# Patient Record
Sex: Male | Born: 1990 | Race: White | Hispanic: No | Marital: Single | Smoking: Current every day smoker
Health system: Southern US, Community
[De-identification: ages and names within clinical notes are randomized; demographics above are authoritative.]

## PROBLEM LIST (undated history)

## (undated) DIAGNOSIS — F419 Anxiety disorder, unspecified: Secondary | ICD-10-CM

## (undated) DIAGNOSIS — R569 Unspecified convulsions: Secondary | ICD-10-CM

## (undated) DIAGNOSIS — F988 Other specified behavioral and emotional disorders with onset usually occurring in childhood and adolescence: Secondary | ICD-10-CM

## (undated) HISTORY — PX: APPENDECTOMY: SHX54

## (undated) HISTORY — PX: COLON SURGERY: SHX602

## (undated) HISTORY — DX: Other specified behavioral and emotional disorders with onset usually occurring in childhood and adolescence: F98.8

## (undated) HISTORY — DX: Anxiety disorder, unspecified: F41.9

---
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
## 2013-03-17 DIAGNOSIS — R569 Unspecified convulsions: Secondary | ICD-10-CM

## 2013-03-17 HISTORY — DX: Unspecified convulsions: R56.9

## 2013-12-20 ENCOUNTER — Encounter (HOSPITAL_COMMUNITY): Payer: Self-pay | Admitting: Emergency Medicine

## 2013-12-20 ENCOUNTER — Emergency Department (HOSPITAL_COMMUNITY): Payer: Managed Care, Other (non HMO)

## 2013-12-20 ENCOUNTER — Emergency Department (HOSPITAL_COMMUNITY)
Admission: EM | Admit: 2013-12-20 | Discharge: 2013-12-21 | Disposition: A | Discharge status: Home / Self Care | Payer: Managed Care, Other (non HMO) | Attending: Emergency Medicine | Admitting: Emergency Medicine

## 2013-12-20 DIAGNOSIS — R569 Unspecified convulsions: Secondary | ICD-10-CM | POA: Diagnosis not present

## 2013-12-20 DIAGNOSIS — R4182 Altered mental status, unspecified: Secondary | ICD-10-CM | POA: Diagnosis not present

## 2013-12-20 DIAGNOSIS — Z79899 Other long term (current) drug therapy: Secondary | ICD-10-CM | POA: Insufficient documentation

## 2013-12-20 DIAGNOSIS — Y92410 Unspecified street and highway as the place of occurrence of the external cause: Secondary | ICD-10-CM | POA: Insufficient documentation

## 2013-12-20 DIAGNOSIS — Z720 Tobacco use: Secondary | ICD-10-CM | POA: Diagnosis not present

## 2013-12-20 DIAGNOSIS — Z043 Encounter for examination and observation following other accident: Secondary | ICD-10-CM | POA: Diagnosis present

## 2013-12-20 DIAGNOSIS — Y9389 Activity, other specified: Secondary | ICD-10-CM | POA: Insufficient documentation

## 2013-12-20 HISTORY — DX: Unspecified convulsions: R56.9

## 2013-12-20 LAB — BASIC METABOLIC PANEL
Anion gap: 12 (ref 5–15)
BUN: 15 mg/dL (ref 6–23)
CO2: 27 meq/L (ref 19–32)
CREATININE: 1.1 mg/dL (ref 0.50–1.35)
Calcium: 9.1 mg/dL (ref 8.4–10.5)
Chloride: 101 mEq/L (ref 96–112)
GFR calc Af Amer: >90 mL/min (ref >90)
GFR calc non Af Amer: >90 mL/min (ref >90)
GLUCOSE: 90 mg/dL (ref 70–99)
Potassium: 4.1 mEq/L (ref 3.7–5.3)
Sodium: 140 mEq/L (ref 137–147)

## 2013-12-20 LAB — CBG MONITORING, ED: Glucose-Capillary: 79 mg/dL (ref 70–99)

## 2013-12-20 LAB — CBC
HEMATOCRIT: 41.5 % (ref 39.0–52.0)
HEMOGLOBIN: 14.1 g/dL (ref 13.0–17.0)
MCH: 30.9 pg (ref 26.0–34.0)
MCHC: 34 g/dL (ref 30.0–36.0)
MCV: 91 fL (ref 78.0–100.0)
Platelets: 175 10*3/uL (ref 150–400)
RBC: 4.56 MIL/uL (ref 4.22–5.81)
RDW: 12.6 % (ref 11.5–15.5)
WBC: 9.5 10*3/uL (ref 4.0–10.5)
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
## 2013-12-21 MED ORDER — AMPHETAMINE-DEXTROAMPHETAMINE 30 MG PO TABS: 30.0000 mg | ORAL_TABLET | Freq: Every day | ORAL | Status: DC

## 2013-12-21 MED ORDER — IBUPROFEN 400 MG PO TABS
400.0000 mg | ORAL_TABLET | Freq: Once | ORAL | Status: AC
  Administered 2013-12-21: 400 mg via ORAL
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
## 2013-12-27 ENCOUNTER — Encounter: Payer: Self-pay | Admitting: Neurology

## 2013-12-27 ENCOUNTER — Ambulatory Visit (INDEPENDENT_AMBULATORY_CARE_PROVIDER_SITE_OTHER): Payer: Managed Care, Other (non HMO) | Admitting: Neurology

## 2013-12-27 ENCOUNTER — Encounter: Payer: Self-pay | Admitting: *Deleted

## 2013-12-27 VITALS — BP 143/91 | HR 79 | Ht 73.0 in | Wt 177.6 lb

## 2013-12-27 DIAGNOSIS — F909 Attention-deficit hyperactivity disorder, unspecified type: Secondary | ICD-10-CM

## 2013-12-27 NOTE — Patient Instructions (Addendum)
I had a long discussion with the patient about his episode of altered consciousness and possible seizure discuss seizure provoking factors likely deprivation and klonopin/Ritalin withdrawal. He was counseled to avoid seizure provoking triggers like sleep deprivation, irregular eating and sleeping habits, abuse drugs, alcohol and marijuana. Check EEG and MRI scan of the brain. We will hold off on anticonvulsants at the present time unless the EEG or MRI are abnormal or he has a second episode He was advised not to drive for the next 6 months as per [facility] [place] law and not to operate heavy equipment. Return for followup in 2 months or call earlier if necessary  Seizure, Adult A seizure is abnormal electrical activity in the brain. Seizures usually last from 30 seconds to 2 minutes. There are various types of seizures. Before a seizure, you may have a warning sensation (aura) that a seizure is about to occur. An aura may include the following symptoms:   Fear or anxiety.  Nausea.  Feeling like the room is spinning (vertigo).  Vision changes, such as seeing flashing lights or spots. Common symptoms during a seizure include:  A change in attention or behavior (altered mental status).  Convulsions with rhythmic jerking movements.  Drooling.  Rapid eye movements.  Grunting.  Loss of bladder and bowel control.  Bitter taste in the mouth.  Tongue biting. After a seizure, you may feel confused and sleepy. You may also have an injury resulting from convulsions during the seizure. HOME CARE INSTRUCTIONS   If you are given medicines, take them exactly as prescribed by your health care provider.  Keep all follow-up appointments as directed by your health care provider.  Do not swim or drive or engage in risky activity during which a seizure could cause further injury to you or others until your health care provider says it is OK.  Get adequate rest.  Teach friends and family what to do  if you have a seizure. They should:  Lay you on the ground to prevent a fall.  Put a cushion under your head.  Loosen any tight clothing around your neck.  Turn you on your side. If vomiting occurs, this helps keep your airway clear.  Stay with you until you recover.  Know whether or not you need emergency care. SEEK IMMEDIATE MEDICAL CARE IF:  The seizure lasts longer than 5 minutes.  The seizure is severe or you do not wake up immediately after the seizure.  You have an altered mental status after the seizure.  You are having more frequent or worsening seizures. Someone should drive you to the emergency department or call local emergency services (911 in [place] MAKE SURE YOU:  Understand these instructions.  Will watch your condition.  Will get help right away if you are not doing well or get worse. Document Released: 02/29/2000 Document Revised: 12/22/2012 Document Reviewed: 10/13/2012 [facility] Patient Information 2015 [place], [place]. This information is not intended to replace advice given to you by your health care provider. Make sure you discuss any questions you have with your health care provider.
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
## 2013-12-29 ENCOUNTER — Other Ambulatory Visit (INDEPENDENT_AMBULATORY_CARE_PROVIDER_SITE_OTHER): Payer: Managed Care, Other (non HMO)

## 2013-12-29 ENCOUNTER — Encounter: Payer: Self-pay | Admitting: Neurology

## 2013-12-29 ENCOUNTER — Telehealth: Payer: Self-pay | Admitting: Neurology

## 2013-12-29 DIAGNOSIS — R404 Transient alteration of awareness: Secondary | ICD-10-CM

## 2013-12-29 DIAGNOSIS — F909 Attention-deficit hyperactivity disorder, unspecified type: Secondary | ICD-10-CM

## 2013-12-29 NOTE — Telephone Encounter (Signed)
Called pt to inform him that Dr. Pearlean BrownieSethi has written his note for work and note left at the front desk and if he has any other problem, questions or concerns to call the office. Pt verbalized understanding.

## 2013-12-30 ENCOUNTER — Telehealth: Payer: Self-pay | Admitting: Neurology

## 2013-12-30 NOTE — Telephone Encounter (Signed)
pts mother is on release--calling to get results of eeg for son please call patient

## 2013-12-30 NOTE — Telephone Encounter (Signed)
EEG is normal. Kindly inform her

## 2014-01-02 ENCOUNTER — Telehealth: Payer: Self-pay | Admitting: Neurology

## 2014-01-02 NOTE — Telephone Encounter (Signed)
pts mother is on ther release for information and was given the normal eeg results.  Mother, Harriett Sineancy says son needs a release note so that son can return to work since.  Please let patient know if he is able to return to work and provide letter today.

## 2014-01-04 ENCOUNTER — Encounter: Payer: Self-pay | Admitting: *Deleted

## 2014-01-04 ENCOUNTER — Ambulatory Visit (INDEPENDENT_AMBULATORY_CARE_PROVIDER_SITE_OTHER): Payer: Managed Care, Other (non HMO)

## 2014-01-04 ENCOUNTER — Other Ambulatory Visit: Payer: Managed Care, Other (non HMO)

## 2014-01-04 DIAGNOSIS — F909 Attention-deficit hyperactivity disorder, unspecified type: Secondary | ICD-10-CM

## 2014-01-04 NOTE — Telephone Encounter (Signed)
Patient is here today in the lobby requesting note, he is having MRI done today, Dr Pearlean BrownieSethi is out of the office this week, waiting on patient to get out of MRI to complete letter.

## 2014-01-05 NOTE — Telephone Encounter (Signed)
Patient calling for release note.  Please call when ready for pick up

## 2014-01-06 ENCOUNTER — Encounter: Payer: Self-pay | Admitting: Neurology

## 2014-01-06 NOTE — Telephone Encounter (Signed)
Waiting on approval to write letter from Dr Pearlean BrownieSethi.

## 2014-01-06 NOTE — Telephone Encounter (Signed)
Patient's mother came in today and picked up letter for patient but has some concerns about his diagnosis, she wanted to know if all of his test were coming back normal then why was he being diagnosed with seizure? Informed her that Dr Pearlean BrownieSethi is out of the office and that he would have to respond to her question when he returns back in the office.

## 2014-01-06 NOTE — Telephone Encounter (Signed)
Patient's letter was written by Dr Roda ShuttersXu, patient has been called to pick up letter.

## 2014-01-12 NOTE — Telephone Encounter (Signed)
Spoke to mom and answered questions. Need to move f/u appointment earlier to early December

## 2014-01-13 NOTE — Telephone Encounter (Signed)
Called patient and left voice message to call the office back to r/s appointment for when Dr Pearlean BrownieSethi is in the office.

## 2014-01-17 ENCOUNTER — Telehealth: Payer: Self-pay | Admitting: *Deleted

## 2014-01-17 NOTE — Telephone Encounter (Signed)
Form,F[employer] to [place] and Dr Pearlean BrownieSethi 01-17-14.

## 2014-01-18 DIAGNOSIS — Z0289 Encounter for other administrative examinations: Secondary | ICD-10-CM

## 2014-02-01 NOTE — Telephone Encounter (Signed)
Patient's mother checking status of FMLA paperwork dropped off 11/3.  Please call and advise.

## 2014-02-02 NOTE — Telephone Encounter (Signed)
Spoke with patient's mother informed her that form just needs to be signed by Dr Pearlean BrownieSethi. Form is in Dr Marlis EdelsonSethi's office in his box. Dr Pearlean BrownieSethi when you return to the office please complete.

## 2014-02-06 NOTE — Telephone Encounter (Signed)
Form, FMLA [employer] received from Dr Pearlean BrownieSethi and Andreas Blowerashia completed at front desk for patient 02/06/14.

## 2014-02-23 ENCOUNTER — Ambulatory Visit (INDEPENDENT_AMBULATORY_CARE_PROVIDER_SITE_OTHER): Payer: Managed Care, Other (non HMO) | Admitting: Family Medicine

## 2014-02-23 ENCOUNTER — Encounter: Payer: Self-pay | Admitting: Family Medicine

## 2014-02-23 VITALS — BP 122/78 | HR 82 | Temp 98.6°F | Resp 18 | Ht 72.0 in | Wt 195.0 lb

## 2014-02-23 DIAGNOSIS — R569 Unspecified convulsions: Secondary | ICD-10-CM

## 2014-02-23 DIAGNOSIS — F900 Attention-deficit hyperactivity disorder, predominantly inattentive type: Secondary | ICD-10-CM

## 2014-02-23 DIAGNOSIS — F411 Generalized anxiety disorder: Secondary | ICD-10-CM

## 2014-02-23 DIAGNOSIS — F909 Attention-deficit hyperactivity disorder, unspecified type: Secondary | ICD-10-CM

## 2014-02-23 MED ORDER — AMPHETAMINE-DEXTROAMPHETAMINE 30 MG PO TABS: 30.0000 mg | ORAL_TABLET | Freq: Two times a day (BID) | ORAL | Status: DC

## 2014-02-23 MED ORDER — AMPHETAMINE-DEXTROAMPHETAMINE 30 MG PO TABS
30.0000 mg | ORAL_TABLET | Freq: Two times a day (BID) | ORAL | Status: DC
Start: 2014-02-23 — End: 2014-06-19

## 2014-02-23 MED ORDER — CLONAZEPAM 1 MG PO TABS: 1.0000 mg | ORAL_TABLET | Freq: Two times a day (BID) | ORAL | Status: DC | PRN

## 2014-02-23 MED ORDER — AMPHETAMINE-DEXTROAMPHETAMINE 30 MG PO TABS
30.0000 mg | ORAL_TABLET | Freq: Two times a day (BID) | ORAL | Status: DC
Start: 2014-02-23 — End: 2014-02-23

## 2014-02-23 NOTE — Progress Notes (Signed)
Office Note 03/03/2014  CC:  Chief Complaint  Patient presents with  . Establish Care    [facility]   HPI:  Jose Rivera is a 23 y.o.  male who is here to establish care.  Patient's most recent primary MD: [facility]. Old records in EPIC/HL (neuro) were reviewed prior to or during today's visit.   He is interested in getting back on his adderall and clonazepam. He has had some trouble with having to see multiple providers and go back to his PCP office on many occasions to get his adderall and clonaz rx's renewed.  He was off them for a time in October and was sleep deprived and he had a seizure.  Saw neurologist, normal w/u and no meds started unless they become recurrent (which they have not) or if MRI or EEG abnormal (both were normal).  He was told by Dr. Pearlean BrownieSethi not to drive but pt says "I have to drive in order to keep my job".  Says he has been on adderall at least 12 years for ADHD and clonazepam for about 7 years for anxiety. Says current doses of both meds have been effective.  Past Medical History  Diagnosis Date  . Seizure 2015    x 1, with normal EEG and normal brain MRI: no meds started by neurologist.  Also childhood hx of febrile seizures  . ADD (attention deficit disorder)   . Anxiety     Past Surgical History  Procedure Laterality Date  . Appendectomy    . Colon surgery      ? volvulus related to appendicitis? pt unable to recall or describe well but says some of colon was removed.    Family History  Problem Relation Age of Onset  . Alcohol abuse Father     History   Social History  . Marital Status: Single    Spouse Name: N/A    Number of Children: 2  . Years of Education: 12th   Occupational History  . warehouse    Social History Main Topics  . Smoking status: Current Every Day Smoker    Types: Cigarettes  . Smokeless tobacco: Never Used  . Alcohol Use: No  . Drug Use: No     Comment: 2 months  . Sexual Activity:     Partners: Female   Other Topics Concern  . Not on file   Social History Narrative   Patient lives at home with his mother.  Pt is single, has 2 sons, one of which lives with him.   Patient is right handed.   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   HS education.   Patient drink sodas and sweet tea.   No T/A/Ds.    Outpatient Encounter Prescriptions as of 02/23/2014  Medication Sig  . amphetamine-dextroamphetamine (ADDERALL) 30 MG tablet Take 1 tablet by mouth 2 (two) times daily.  . clonazePAM (KLONOPIN) 1 MG tablet Take 1 tablet (1 mg total) by mouth 2 (two) times daily as needed.  . [DISCONTINUED] Amphetamine-Dextroamphetamine (ADDERALL PO) Take 30 mg by mouth 2 (two) times daily.   . [DISCONTINUED] amphetamine-dextroamphetamine (ADDERALL) 30 MG tablet Take 1 tablet by mouth 2 (two) times daily.  . [DISCONTINUED] amphetamine-dextroamphetamine (ADDERALL) 30 MG tablet Take 1 tablet by mouth 2 (two) times daily.  . [DISCONTINUED] clonazePAM (KLONOPIN) 1 MG tablet Take 1 mg by mouth 2 (two) times daily as needed.     No Known Allergies  ROS Review  of Systems  Constitutional: Negative for fever and fatigue.  HENT: Negative for congestion and sore throat.   Eyes: Negative for visual disturbance.  Respiratory: Negative for cough.   Cardiovascular: Negative for chest pain.  Gastrointestinal: Negative for nausea and abdominal pain.  Genitourinary: Negative for dysuria.  Musculoskeletal: Negative for back pain and joint swelling.  Skin: Negative for rash.       A few axillary cysts recently but these have dried up pretty well now.  Neurological: Negative for weakness and headaches.  Hematological: Negative for adenopathy.    PE; Blood pressure 122/78, pulse 82, temperature 98.6 F (37 C), temperature source Temporal, resp. rate 18, height 6' (1.829 m), weight 195 lb (88.451 kg), SpO2 98 %. Wt Readings from Last 2 Encounters:  02/23/14 195 lb (88.451 kg)   12/27/13 177 lb 9.6 oz (80.559 kg)    Gen: alert, oriented x 4, affect pleasant.  Lucid thinking and conversation noted. HEENT: PERRLA, EOMI.   Neck: no LAD, mass, or thyromegaly. CV: RRR, no m/r/g LUNGS: CTA bilat, nonlabored. NEURO: no tremor or tics noted on observation.  Coordination intact. CN 2-12 grossly intact bilaterally, strength 5/5 in all extremeties.  No ataxia.  Pertinent labs:  Lab Results  Component Value Date   WBC 9.5 12/20/2013   HGB 14.1 12/20/2013   HCT 41.5 12/20/2013   MCV 91.0 12/20/2013   PLT 175 12/20/2013     Chemistry      Component Value Date/Time   NA 140 12/20/2013 1932   K 4.1 12/20/2013 1932   CL 101 12/20/2013 1932   CO2 27 12/20/2013 1932   BUN 15 12/20/2013 1932   CREATININE 1.10 12/20/2013 1932      Component Value Date/Time   CALCIUM 9.1 12/20/2013 1932      ASSESSMENT AND PLAN:   New pt; obtain old records.  1) Adult ADHD and generalized anxiety, medicated long term.  I checked the [place] Controlled substances reporting system web site and it correlates with who he says was rx'ing his adderall and clonazepam. Last adderall and clonaz fill date was 12/29/13. Controlled substance contract signed by pt today. I printed rx's for adderall 30mg , 1 bid and clonazepam 1mg , 1 bid, #60 today for this month, Jan 2016, and Feb 2016.  Appropriate fill on/after date was noted on each rx.  2) Seizure: I discouraged pt from driving.  He really thinks the seizure was from w/drawal from adderall, clonaz, + sleep deprivation.    An After Visit Summary was printed and given to the patient.  Return in about 4 months (around 06/25/2014) for f/u ADD and anxiety.

## 2014-02-23 NOTE — Telephone Encounter (Signed)
Jasmine DecemberSharon with [employer] @ [phone], stated FMLA paperwork received wasn't completed.  Please call and advise.

## 2014-02-24 ENCOUNTER — Telehealth: Payer: Self-pay | Admitting: General Practice

## 2014-02-24 NOTE — Telephone Encounter (Signed)
emmi mailed  °

## 2014-02-27 NOTE — Telephone Encounter (Signed)
Retrieved form from Medical Records called number listed below for [facility], phone rings one time then complete silence, no one never answers the phone. Will try again at a later time.

## 2014-02-27 NOTE — Telephone Encounter (Signed)
DIRECTVCalled insurance company back and spoke with Burnetta Sabinvey, she states that patient was supposed to have disability form filled out, she could not tell me what needed to be corrected on FMLA paperwork. Fax number was given to fax disability form to our office.

## 2014-03-03 ENCOUNTER — Encounter: Payer: Self-pay | Admitting: Family Medicine

## 2014-04-17 ENCOUNTER — Telehealth: Payer: Self-pay | Admitting: Neurology

## 2014-04-17 NOTE — Telephone Encounter (Signed)
Pt is calling to cancel his appt for 04/19/14.  He states he does not feel the need to come back, but he is requesting a phone call back and a letter written to release him to go back to work by 05/21/14.  Please call and advise.

## 2014-04-18 NOTE — Telephone Encounter (Signed)
Dr Pearlean BrownieSethi patient requesting form completed so that he can go back to work on 05/21/14 please complete, form has been here since early December

## 2014-04-19 ENCOUNTER — Telehealth: Payer: Self-pay | Admitting: *Deleted

## 2014-04-19 ENCOUNTER — Ambulatory Visit: Payer: Managed Care, Other (non HMO) | Admitting: Neurology

## 2014-04-19 NOTE — Telephone Encounter (Signed)
Form,[employer] received,completed by Dr Pearlean BrownieSethi and Andreas Blowerashia at front desk for patient 04-19-14.

## 2014-05-10 ENCOUNTER — Telehealth: Payer: Self-pay | Admitting: Family Medicine

## 2014-05-10 NOTE — Telephone Encounter (Signed)
ClonazePam [facility]

## 2014-05-10 NOTE — Telephone Encounter (Signed)
Patient's clonazepam has 5 refills on it.  Pt didn't understand that when I called him. He voiced understanding that he has refills and he will contact pharmacy.

## 2014-06-01 ENCOUNTER — Telehealth: Payer: Self-pay | Admitting: Neurology

## 2014-06-01 NOTE — Telephone Encounter (Signed)
Pt is calling stating that on 4/6 he will be seizure free for 6 months and would like a letter stating that he is ready to go back to work.  Please call and advise.

## 2014-06-02 NOTE — Telephone Encounter (Signed)
Yes he can drive from 1/6/104/6/16 as it will be 6 months since his episode

## 2014-06-02 NOTE — Telephone Encounter (Signed)
Patient states that he will be seizure free on 4/6 and would like a letter so that he can drive again and go back to work.

## 2014-06-02 NOTE — Telephone Encounter (Signed)
Spoke with patient and informed him that letter can not be written until April 6 th, informed patient to call either April 5 th or April 6 th and letter will be written.

## 2014-06-19 ENCOUNTER — Encounter: Payer: Self-pay | Admitting: Family Medicine

## 2014-06-19 ENCOUNTER — Ambulatory Visit (INDEPENDENT_AMBULATORY_CARE_PROVIDER_SITE_OTHER): Payer: Managed Care, Other (non HMO) | Admitting: Family Medicine

## 2014-06-19 VITALS — BP 114/60 | HR 69 | Temp 98.2°F | Ht 72.0 in | Wt 205.0 lb

## 2014-06-19 DIAGNOSIS — F411 Generalized anxiety disorder: Secondary | ICD-10-CM | POA: Insufficient documentation

## 2014-06-19 DIAGNOSIS — F419 Anxiety disorder, unspecified: Secondary | ICD-10-CM | POA: Insufficient documentation

## 2014-06-19 DIAGNOSIS — F900 Attention-deficit hyperactivity disorder, predominantly inattentive type: Secondary | ICD-10-CM | POA: Diagnosis not present

## 2014-06-19 DIAGNOSIS — R569 Unspecified convulsions: Secondary | ICD-10-CM | POA: Diagnosis not present

## 2014-06-19 MED ORDER — AMPHETAMINE-DEXTROAMPHETAMINE 30 MG PO TABS: 30.0000 mg | ORAL_TABLET | Freq: Two times a day (BID) | ORAL | Status: DC

## 2014-06-19 MED ORDER — AMPHETAMINE-DEXTROAMPHETAMINE 30 MG PO TABS
30.0000 mg | ORAL_TABLET | Freq: Two times a day (BID) | ORAL | Status: DC
Start: 2014-06-19 — End: 2014-06-19

## 2014-06-19 NOTE — Progress Notes (Signed)
OFFICE NOTE  06/19/2014  CC:  Chief Complaint  Patient presents with  . Follow-up     HPI: Patient is a 24 y.o. Caucasian male who is here for 4 mo f/u ADD and anxiety. Says all is well.  Has visitation rights with his children now. Restarts his job in a couple days after having been out due to seizure: he has had no recurrence.  Neuro has cleared him to drive and return to work. Still currently living with his mother.   Pertinent PMH:  Past medical, surgical, social, and family history reviewed and no changes are noted since last office visit.  MEDS:  Outpatient Prescriptions Prior to Visit  Medication Sig Dispense Refill  . amphetamine-dextroamphetamine (ADDERALL) 30 MG tablet Take 1 tablet by mouth 2 (two) times daily. 60 tablet 0  . clonazePAM (KLONOPIN) 1 MG tablet Take 1 tablet (1 mg total) by mouth 2 (two) times daily as needed. 60 tablet 5   No facility-administered medications prior to visit.    PE: Blood pressure 114/60, pulse 69, temperature 98.2 F (36.8 C), temperature source Oral, height 6' (1.829 m), weight 205 lb (92.987 kg), SpO2 97 %. Wt Readings from Last 2 Encounters:  06/19/14 205 lb (92.987 kg)  02/23/14 195 lb (88.451 kg)    Gen: alert, oriented x 4, affect pleasant.  Lucid thinking and conversation noted. HEENT: PERRLA, EOMI.   Neck: no LAD, mass, or thyromegaly. CV: RRR, no m/r/g LUNGS: CTA bilat, nonlabored. NEURO: no tremor or tics noted on observation.  Coordination intact. CN 2-12 grossly intact bilaterally, strength 5/5 in all extremeties.  No ataxia.   IMPRESSION AND PLAN:  1) ADHD, inattentive type: The current medical regimen is effective;  continue present plan and medications. I printed rx's for adderall 30mg  1 tab bid, #60  today for this month, May 2016, and June 2016.  Appropriate fill on/after date was noted on each rx.  2) Generalized anxiety: The current medical regimen is effective;  continue present plan and  medications. No new rx needed today.  3) Hx of seizure: one time, no recurrence.  W/u unrevealing.  No meds. Neuro has cleared him to work again, drive again.  An After Visit Summary was printed and given to the patient.  FOLLOW UP: 1mo

## 2014-06-19 NOTE — Progress Notes (Signed)
Pre visit review using our clinic review tool, if applicable. No additional management support is needed unless otherwise documented below in the visit note. 

## 2014-06-20 NOTE — Telephone Encounter (Signed)
Spoke to patient's mom. Advised letter would be available for pick up at the front desk. She was grateful.

## 2014-06-20 NOTE — Telephone Encounter (Signed)
Patient calling back as instructed to check status of letter.  Please call and advise.
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
## 2021-01-23 ENCOUNTER — Encounter (HOSPITAL_BASED_OUTPATIENT_CLINIC_OR_DEPARTMENT_OTHER): Payer: Self-pay | Admitting: Emergency Medicine

## 2021-01-23 ENCOUNTER — Other Ambulatory Visit: Payer: Self-pay

## 2021-01-23 DIAGNOSIS — R030 Elevated blood-pressure reading, without diagnosis of hypertension: Secondary | ICD-10-CM | POA: Insufficient documentation

## 2021-01-23 DIAGNOSIS — K029 Dental caries, unspecified: Secondary | ICD-10-CM | POA: Insufficient documentation

## 2021-01-23 DIAGNOSIS — F17210 Nicotine dependence, cigarettes, uncomplicated: Secondary | ICD-10-CM | POA: Insufficient documentation

## 2021-01-23 NOTE — ED Triage Notes (Signed)
Pt POV reports dental pain to left side upper and lower wisdom teeth area. Pt reports dental carries in the area, teeth are also broken. Denies fevers. C/o headache, sinus congestion. No difficulty swallowing, no facial swelling.

## 2021-01-24 ENCOUNTER — Emergency Department (HOSPITAL_BASED_OUTPATIENT_CLINIC_OR_DEPARTMENT_OTHER)
Admission: EM | Admit: 2021-01-24 | Discharge: 2021-01-24 | Disposition: A | Discharge status: Home / Self Care | Payer: Managed Care, Other (non HMO) | Attending: Emergency Medicine | Admitting: Emergency Medicine

## 2021-01-24 DIAGNOSIS — K029 Dental caries, unspecified: Secondary | ICD-10-CM

## 2021-01-24 MED ORDER — OXYCODONE-ACETAMINOPHEN 5-325 MG PO TABS: 1.0000 | ORAL_TABLET | ORAL | 0 refills | Status: DC | PRN

## 2021-01-24 MED ORDER — OXYCODONE-ACETAMINOPHEN 5-325 MG PO TABS
1.0000 | ORAL_TABLET | Freq: Once | ORAL | Status: AC
Start: 2021-01-24 — End: 2021-01-24
  Administered 2021-01-24: 1 via ORAL
  Filled 2021-01-24: qty 1

## 2021-01-24 MED ORDER — AMOXICILLIN 500 MG PO CAPS: 1000.0000 mg | ORAL_CAPSULE | Freq: Two times a day (BID) | ORAL | 0 refills | Status: DC

## 2021-01-24 MED ORDER — AMOXICILLIN 500 MG PO CAPS
1000.0000 mg | ORAL_CAPSULE | Freq: Once | ORAL | Status: AC
  Administered 2021-01-24: 1000 mg via ORAL
  Filled 2021-01-24: qty 2

## 2021-01-24 NOTE — Discharge Instructions (Addendum)
You may take acetaminophen and/or ibuprofen as needed for pain.  Please note that if you combine them, you will get better pain relief than taking either medication by itself.  However, never take more than 4 ibuprofen tablets at a time and never take more than 2 acetaminophen tablets at a time.  Maximum number of ibuprofen tablets you can safely take in a day is 12, maximum number of acetaminophen tablets you can take in a day is 8.  Taking more than these maximum amounts puts you at risk for kidney damage, bleeding ulcers, liver failure.

## 2021-01-24 NOTE — ED Provider Notes (Signed)
[facility] EMERGENCY DEPT Provider Note   CSN: [id number] Arrival date & time: 01/23/21  2030     History Chief Complaint  Patient presents with   Dental Pain    Jose Rivera is a 30 y.o. male.  The history is provided by the patient.  Dental Pain He has history of attention deficit disorder and comes in complaining of pain in both of the wisdom teeth on his left side.  He states that he has been having difficulty with his teeth for several years, but yesterday the left upper wisdom tooth broke and it has been more painful.  He rates pain a 10/10.  He has been taking large amounts of acetaminophen and ibuprofen for pain without relief.  He is very reluctant to tell me how many pain pills he has taken.  He has tried to get into see a dentist, but they are requesting more money upfront than he has available currently.  He has had the wisdom teeth on the right side removed.   Past Medical History:  Diagnosis Date   ADD (attention deficit disorder)    Anxiety    Seizure (HCC) 2015   x 1, with normal EEG and normal brain MRI: no meds started by neurologist.  Also childhood hx of febrile seizures    Patient Active Problem List   Diagnosis Date Noted   Anxiety state 06/19/2014   ADHD (attention deficit hyperactivity disorder) 12/27/2013   Seizure (HCC)     Past Surgical History:  Procedure Laterality Date   APPENDECTOMY     COLON SURGERY     ? volvulus related to appendicitis? pt unable to recall or describe well but says some of colon was removed.       Family History  Problem Relation Age of Onset   Alcohol abuse Father     Social History   Tobacco Use   Smoking status: Every Day    Types: Cigarettes   Smokeless tobacco: Never  Substance Use Topics   Alcohol use: No   Drug use: No    Types: Marijuana    Comment: 2 months    Home Medications Prior to Admission medications   Medication Sig Start Date End Date Taking? Authorizing Provider   amoxicillin (AMOXIL) 500 MG capsule Take 2 capsules (1,000 mg total) by mouth 2 (two) times daily. 01/24/21  Yes Dione Booze, MD  oxyCODONE-acetaminophen (PERCOCET) 5-325 MG tablet Take 1 tablet by mouth every 4 (four) hours as needed for moderate pain. 01/24/21  Yes Dione Booze, MD  amphetamine-dextroamphetamine (ADDERALL) 30 MG tablet Take 1 tablet by mouth 2 (two) times daily. 06/19/14   McGowen, Maryjean Morn, MD  clonazePAM (KLONOPIN) 1 MG tablet Take 1 tablet (1 mg total) by mouth 2 (two) times daily as needed. 02/23/14   McGowen, Maryjean Morn, MD    Allergies    Patient has no known allergies.  Review of Systems   Review of Systems  All other systems reviewed and are negative.  Physical Exam Updated Vital Signs BP (!) 143/91 (BP Location: Right Arm)   Pulse 68   Temp 98 F (36.7 C) (Oral)   Resp 15   Ht 6\' 1"  (1.854 m)   Wt 90.7 kg   SpO2 100%   BMI 26.39 kg/m   Physical Exam Vitals and nursing note reviewed.  30 year old male, resting comfortably and in no acute distress. Vital signs are significant for borderline elevated blood pressure. Oxygen saturation is 100%, which is  normal. Head is normocephalic and atraumatic. PERRLA, EOMI. Oropharynx is clear.  Teeth number 16 and 17 are carious and tender to percussion.  There is no swelling, erythema, pallor of the underlying gingiva. Neck is nontender and supple without adenopathy . Lungs are clear without rales, wheezes, or rhonchi. Chest is nontender. Heart has regular rate and rhythm without murmur. Abdomen is soft, flat, nontender. Extremities have no cyanosis or edema, full range of motion is present. Skin is warm and dry without rash. Neurologic: Mental status is normal, cranial nerves are intact, moves all extremities equally.  ED Results / Procedures / Treatments    Procedures Procedures   Medications Ordered in ED Medications  oxyCODONE-acetaminophen (PERCOCET/ROXICET) 5-325 MG per tablet 1 tablet (1 tablet Oral  Given 01/24/21 0206)  amoxicillin (AMOXIL) capsule 1,000 mg (1,000 mg Oral Given 01/24/21 0204)    ED Course  I have reviewed the triage vital signs and the nursing notes.  MDM Rules/Calculators/A&P                         Dental pain secondary to carious third molars.  Old records are reviewed, and he has no relevant past visits.  He is given prescription for amoxicillin and a small number of oxycodone-acetaminophen tablets.  He is given Writer page and also referred to the on-call dentist.  Final Clinical Impression(s) / ED Diagnoses Final diagnoses:  Pain due to dental caries    Rx / DC Orders ED Discharge Orders          Ordered    amoxicillin (AMOXIL) 500 MG capsule  2 times daily        01/24/21 0206    oxyCODONE-acetaminophen (PERCOCET) 5-325 MG tablet  Every 4 hours PRN        01/24/21 0206             Dione Booze, MD 01/24/21 [phone]

## 2021-06-02 ENCOUNTER — Emergency Department (HOSPITAL_COMMUNITY)
Admission: EM | Admit: 2021-06-02 | Discharge: 2021-06-02 | Disposition: A | Discharge status: Home / Self Care | Payer: 59 | Attending: Emergency Medicine | Admitting: Emergency Medicine

## 2021-06-02 ENCOUNTER — Other Ambulatory Visit: Payer: Self-pay

## 2021-06-02 ENCOUNTER — Encounter (HOSPITAL_COMMUNITY): Payer: Self-pay | Admitting: Emergency Medicine

## 2021-06-02 DIAGNOSIS — K5900 Constipation, unspecified: Secondary | ICD-10-CM | POA: Diagnosis not present

## 2021-06-02 DIAGNOSIS — R109 Unspecified abdominal pain: Secondary | ICD-10-CM | POA: Diagnosis present

## 2021-06-02 DIAGNOSIS — R110 Nausea: Secondary | ICD-10-CM | POA: Diagnosis not present

## 2021-06-02 LAB — URINALYSIS, ROUTINE W REFLEX MICROSCOPIC
Bilirubin Urine: NEGATIVE
Glucose, UA: NEGATIVE mg/dL
Hgb urine dipstick: NEGATIVE
Ketones, ur: NEGATIVE mg/dL
Leukocytes,Ua: NEGATIVE
Nitrite: NEGATIVE
Protein, ur: NEGATIVE mg/dL
Specific Gravity, Urine: 1.015 (ref 1.005–1.030)
pH: 7.5 (ref 5.0–8.0)

## 2021-06-02 LAB — COMPREHENSIVE METABOLIC PANEL
ALT: 19 U/L (ref 0–44)
AST: 17 U/L (ref 15–41)
Albumin: 4 g/dL (ref 3.5–5.0)
Alkaline Phosphatase: 35 U/L — ABNORMAL LOW (ref 38–126)
Anion gap: 6 (ref 5–15)
BUN: 15 mg/dL (ref 6–20)
CO2: 29 mmol/L (ref 22–32)
Calcium: 9.2 mg/dL (ref 8.9–10.3)
Chloride: 105 mmol/L (ref 98–111)
Creatinine, Ser: 1.27 mg/dL — ABNORMAL HIGH (ref 0.61–1.24)
GFR, Estimated: >60 mL/min (ref >60)
Glucose, Bld: 90 mg/dL (ref 70–99)
Potassium: 4.5 mmol/L (ref 3.5–5.1)
Sodium: 140 mmol/L (ref 135–145)
Total Bilirubin: 0.7 mg/dL (ref 0.3–1.2)
Total Protein: 6.9 g/dL (ref 6.5–8.1)

## 2021-06-02 LAB — CBC
HCT: 43.6 % (ref 39.0–52.0)
Hemoglobin: 14.8 g/dL (ref 13.0–17.0)
MCH: 30.6 pg (ref 26.0–34.0)
MCHC: 33.9 g/dL (ref 30.0–36.0)
MCV: 90.1 fL (ref 80.0–100.0)
Platelets: 206 10*3/uL (ref 150–400)
RBC: 4.84 MIL/uL (ref 4.22–5.81)
RDW: 11.9 % (ref 11.5–15.5)
WBC: 5.4 10*3/uL (ref 4.0–10.5)
nRBC: 0 % (ref 0.0–0.2)

## 2021-06-02 LAB — LIPASE, BLOOD: Lipase: 40 U/L (ref 11–51)

## 2021-06-02 MED ORDER — POLYETHYLENE GLYCOL 3350 17 GM/SCOOP PO POWD: 1.0000 | Freq: Once | ORAL | 0 refills | Status: AC

## 2021-06-02 MED ORDER — ONDANSETRON 4 MG PO TBDP: 4.0000 mg | ORAL_TABLET | Freq: Three times a day (TID) | ORAL | 0 refills | Status: DC | PRN

## 2021-06-02 NOTE — ED Triage Notes (Signed)
Patient complains of intermittent sharp abdominal pain that occurs at randomly throughout the day and sometimes wakes him from sleep starting approximately one and a half weeks ago. Reports occasional vomiting and diarrhea. Patient is alert, oriented, ambulatory, and in no apparent distress at this time. ?

## 2021-06-02 NOTE — ED Provider Notes (Signed)
?[facility] ?Provider Note ? ? ?CSN: [id number] ?Arrival date & time: 06/02/21  [id number] ? ?  ? ?History ? ?Chief Complaint  ?Patient presents with  ? Abdominal Pain  ? ? ?Guerry Minorsyler B Blinder is a 31 y.o. male. ? ?Patient with no pertinent past medical history presents today with complaints of abdominal pain. He states that same began 1.5 weeks ago with associated nausea and diarrhea with 1 episode of nonbloody emesis 4 days ago. Felt like at the time of symptom onset he had a GI virus, these symptoms have now completely resolved.  He is now left only with occasional nonspecific sharp abdominal pain that is located in different areas of his abdomen every time without any sort of discernible triggers.  He is currently pain-free.  States that he is more constipated than usual, with last bowel movement yesterday morning that was hard.  He has been passing flatus normally.  Denies any hematochezia or melena.  No dysuria or hematuria or flank pain.  Of note, patient does have a history of appendectomy. ? ? ?The history is provided by the patient. No language interpreter was used.  ?Abdominal Pain ?Associated symptoms: no chills, no cough, no diarrhea, no dysuria, no fever, no nausea and no vomiting   ? ?  ? ?Home Medications ?Prior to Admission medications   ?Medication Sig Start Date End Date Taking? Authorizing Provider  ?amoxicillin (AMOXIL) 500 MG capsule Take 2 capsules (1,000 mg total) by mouth 2 (two) times daily. 01/24/21   Dione BoozeGlick, David, MD  ?amphetamine-dextroamphetamine (ADDERALL) 30 MG tablet Take 1 tablet by mouth 2 (two) times daily. 06/19/14   McGowen, Maryjean MornPhilip H, MD  ?clonazePAM (KLONOPIN) 1 MG tablet Take 1 tablet (1 mg total) by mouth 2 (two) times daily as needed. 02/23/14   McGowen, Maryjean MornPhilip H, MD  ?oxyCODONE-acetaminophen (PERCOCET) 5-325 MG tablet Take 1 tablet by mouth every 4 (four) hours as needed for moderate pain. 01/24/21   Dione BoozeGlick, David, MD  ?   ? ?Allergies    ?Patient  has no known allergies.   ? ?Review of Systems   ?Review of Systems  ?Constitutional:  Negative for chills and fever.  ?Respiratory:  Negative for cough.   ?Gastrointestinal:  Positive for abdominal pain (not present currently). Negative for diarrhea, nausea and vomiting.  ?Genitourinary:  Negative for dysuria and testicular pain.  ?All other systems reviewed and are negative. ? ?Physical Exam ?Updated Vital Signs ?BP (!) 150/89 (BP Location: Right Arm)   Pulse 84   Temp 97.6 ?F (36.4 ?C) (Oral)   Resp 16   SpO2 99%  ?Physical Exam ?Vitals and nursing note reviewed.  ?Constitutional:   ?   General: He is not in acute distress. ?   Appearance: Normal appearance. He is normal weight. He is not ill-appearing, toxic-appearing or diaphoretic.  ?   Comments: Patient resting comfortably in bed in no acute distress  ?HENT:  ?   Head: Normocephalic and atraumatic.  ?Cardiovascular:  ?   Rate and Rhythm: Normal rate and regular rhythm.  ?Pulmonary:  ?   Effort: Pulmonary effort is normal. No respiratory distress.  ?   Breath sounds: Normal breath sounds.  ?Abdominal:  ?   General: Abdomen is flat. Bowel sounds are normal.  ?   Palpations: Abdomen is soft.  ?   Tenderness: There is no abdominal tenderness. There is no right CVA tenderness, left CVA tenderness, guarding or rebound. Negative signs include Murphy's sign and McBurney's sign.  ?  Musculoskeletal:     ?   General: Normal range of motion.  ?   Cervical back: Normal range of motion.  ?Skin: ?   General: Skin is warm and dry.  ?Neurological:  ?   General: No focal deficit present.  ?   Mental Status: He is alert.  ?Psychiatric:     ?   Mood and Affect: Mood normal.     ?   Behavior: Behavior normal.  ? ? ?ED Results / Procedures / Treatments   ?Labs ?(all labs ordered are listed, but only abnormal results are displayed) ?Labs Reviewed  ?COMPREHENSIVE METABOLIC PANEL - Abnormal; Notable for the following components:  ?    Result Value  ? Creatinine, Ser 1.27 (*)   ?  Alkaline Phosphatase 35 (*)   ? All other components within normal limits  ?LIPASE, BLOOD  ?CBC  ?URINALYSIS, ROUTINE W REFLEX MICROSCOPIC  ? ? ?EKG ?None ? ?Radiology ?No results found. ? ?Procedures ?Procedures  ? ? ?Medications Ordered in ED ?Medications - No data to display ? ?ED Course/ Medical Decision Making/ A&P ?Clinical Course as of 06/02/21 1136  ?Wynelle Link Jun 02, 2021  ?1131 This is a 31 year old male with a history of appendectomy, "mass resection" from the abdomen when he was in middle school, presenting to the ED with abdominal discomfort.  Symptoms been ongoing for a week, waxing and waning.  There is no specific location of his pain but overall discomfort, associated with nausea.  He does feel that he is more constipated than normal, he did have a bowel movement yesterday.  He denies dysuria or hematuria.  He denies testicular pain or tenderness.  On exam patient appears comfortable, he has no focal tenderness, negative Murphy sign.  No rebound or guarding on abdominal exam.  Blood test showed no leukocytosis, normal lipase, normal LFTs.  We are awaiting a UA.  Differential would include at this point constipation versus other intra-abdominal process.  [MT]  ?  ?Clinical Course User Index ?[MT] Terald Sleeper, MD  ? ?                        ?Medical Decision Making ?Amount and/or Complexity of Data Reviewed ?Labs: ordered. ? ? ?This patient presents to the ED for concern of abdominal pain, this involves an extensive number of treatment options, and is a complaint that carries with it a high risk of complications and morbidity. ? ? ?Co morbidities that complicate the patient evaluation ? ?none ? ? ?Lab Tests: ? ?I Ordered, and personally interpreted labs.  The pertinent results include:  Slight creatinine elevation, no other acute laboratory findings ? ?Patient presents today with abdominal pain that began a week and a half ago.  He is afebrile, nontoxic-appearing, and in no acute distress with  reassuring vital signs.  Upon my exam, patient is currently pain-free.  He has not had any episodes of vomiting or diarrhea in several days and has been eating and drinking normally.  On my exam his abdomen is soft and nontender.  He has no comorbid conditions with normal laboratory findings.  Given this, I do not feel that imaging is warranted at this time.  I have discussed this with the patient and he is in agreement.  I suspect that his symptoms are due to constipation as he states that he is been having more hard stools lately.  Last bowel movement was yesterday, he has been passing normal flatus.  Very  low suspicion for bowel obstruction at this time.  Will recommend MiraLAX bowel cleanout for constipation.  Patient understanding and amenable with plan, educated on red flag symptoms of prompt immediate return.  Discharged in stable condition. ? ? ?This is a shared visit with supervising physician Dr. Renaye Rakers who has independently evaluated patient & provided guidance in evaluation/management/disposition, in agreement with care  ? ? ?Final Clinical Impression(s) / ED Diagnoses ?Final diagnoses:  ?Constipation, unspecified constipation type  ? ? ?Rx / DC Orders ?ED Discharge Orders   ? ?      Ordered  ?  ondansetron (ZOFRAN-ODT) 4 MG disintegrating tablet  Every 8 hours PRN       ? 06/02/21 1223  ?  polyethylene glycol powder (GLYCOLAX/MIRALAX) 17 GM/SCOOP powder   Once       ? 06/02/21 1225  ? ?  ?  ? ?  ?An After Visit Summary was printed and given to the patient. ? ? ?  ?Silva Bandy, PA-C ?06/02/21 1225 ? ?  ?Tegeler, Canary Brim, MD ?06/02/21 1359 ? ?

## 2021-06-02 NOTE — Discharge Instructions (Addendum)
Your abdominal pain today is likely due to constipation. You may do a Miralax bowel cleanout for relief which includes 8 capfulls of Miralax in Gatorade. Drink this in 2 hours on a day where you dont have plans and expect significant bowel movements throughout the day ending in liquid stools. You may continue forward with 1 capfull of Miralax twice a day if difficulties with bowel movements continue.  ? ?I have also given you a prescription for Zofran which is a nausea medication which you can take as prescribed as needed. ? ?Return if development of any new or worsening symptoms ? ?

## 2021-06-02 NOTE — ED Notes (Addendum)
Patient unable to provide urine specimen, states he urinated prior to entering triage area. ?

## 2021-07-22 ENCOUNTER — Emergency Department (HOSPITAL_BASED_OUTPATIENT_CLINIC_OR_DEPARTMENT_OTHER)
Admission: EM | Admit: 2021-07-22 | Discharge: 2021-07-22 | Disposition: A | Discharge status: Home / Self Care | Payer: 59 | Attending: Emergency Medicine | Admitting: Emergency Medicine

## 2021-07-22 ENCOUNTER — Encounter (HOSPITAL_BASED_OUTPATIENT_CLINIC_OR_DEPARTMENT_OTHER): Payer: Self-pay | Admitting: Obstetrics and Gynecology

## 2021-07-22 ENCOUNTER — Other Ambulatory Visit: Payer: Self-pay

## 2021-07-22 ENCOUNTER — Emergency Department (HOSPITAL_BASED_OUTPATIENT_CLINIC_OR_DEPARTMENT_OTHER): Payer: 59 | Admitting: Radiology

## 2021-07-22 DIAGNOSIS — M25572 Pain in left ankle and joints of left foot: Secondary | ICD-10-CM | POA: Insufficient documentation

## 2021-07-22 DIAGNOSIS — W1789XA Other fall from one level to another, initial encounter: Secondary | ICD-10-CM | POA: Insufficient documentation

## 2021-07-22 MED ORDER — ACETAMINOPHEN 325 MG PO TABS
650.0000 mg | ORAL_TABLET | Freq: Once | ORAL | Status: AC
  Administered 2021-07-22: 650 mg via ORAL
  Filled 2021-07-22: qty 2

## 2021-07-22 MED ORDER — IBUPROFEN 400 MG PO TABS
600.0000 mg | ORAL_TABLET | Freq: Once | ORAL | Status: AC
  Administered 2021-07-22: 600 mg via ORAL
  Filled 2021-07-22: qty 1

## 2021-07-22 NOTE — ED Notes (Signed)
Pt refused the boot and crutches. He understood the reason for them being prescribed and refused. ?

## 2021-07-22 NOTE — ED Triage Notes (Signed)
Patient reports to the ER for left ankle pain. States he fell out of a non-moving truck. Reports it happened about 2 hours ago.  ?

## 2021-07-22 NOTE — Discharge Instructions (Addendum)
Take Tylenol or Motrin for pain. ? ?There was no fracture noted on your x-ray.  You likely have a bad sprain. ? ?Use the splint and crutches as needed with weightbearing as tolerated. ? ?Follow-up with your doctor within 1 week.  If you have worsening pain you may benefit from repeat x-rays at that time to evaluate for possible hairline injuries. ? ? ?

## 2021-07-22 NOTE — ED Provider Notes (Addendum)
?[facility] EMERGENCY DEPT ?Provider Note ? ? ?CSN: [id number] ?Arrival date & time: 07/22/21  1724 ? ?  ? ?History ? ?Chief Complaint  ?Patient presents with  ? Ankle Pain  ? ? ?Jose Rivera is a 31 y.o. male. ? ?Patient presents with chief complaint of a fall off of his parked truck.  Injury occurred earlier today just prior to arrival.  Complaining of isolated left ankle pain.  States is painful to try to walk on it.  Otherwise denies head injury neck pain or back pain.  Denies fevers cough vomiting or diarrhea.  Denies loss of consciousness. ? ? ?  ? ?Home Medications ?Prior to Admission medications   ?Medication Sig Start Date End Date Taking? Authorizing Provider  ?amoxicillin (AMOXIL) 500 MG capsule Take 2 capsules (1,000 mg total) by mouth 2 (two) times daily. [id number]   Delora Fuel, MD  ?amphetamine-dextroamphetamine (ADDERALL) 30 MG tablet Take 1 tablet by mouth 2 (two) times daily. 06/19/14   McGowen, Adrian Blackwater, MD  ?clonazePAM (KLONOPIN) 1 MG tablet Take 1 tablet (1 mg total) by mouth 2 (two) times daily as needed. 02/23/14   McGowen, Adrian Blackwater, MD  ?ondansetron (ZOFRAN-ODT) 4 MG disintegrating tablet Take 1 tablet (4 mg total) by mouth every 8 (eight) hours as needed for nausea or vomiting. 06/02/21   Smoot, Leary Roca, PA-C  ?oxyCODONE-acetaminophen (PERCOCET) 5-325 MG tablet Take 1 tablet by mouth every 4 (four) hours as needed for moderate pain. [id number]   Delora Fuel, MD  ?   ? ?Allergies    ?Patient has no known allergies.   ? ?Review of Systems   ?Review of Systems  ?Constitutional:  Negative for fever.  ?HENT:  Negative for ear pain and sore throat.   ?Eyes:  Negative for pain.  ?Respiratory:  Negative for cough.   ?Cardiovascular:  Negative for chest pain.  ?Gastrointestinal:  Negative for abdominal pain.  ?Genitourinary:  Negative for flank pain.  ?Musculoskeletal:  Negative for back pain.  ?Skin:  Negative for color change and rash.  ?Neurological:  Negative for syncope.  ?All other  systems reviewed and are negative. ? ?Physical Exam ?Updated Vital Signs ?BP (!) 145/88 (BP Location: Right Arm)   Pulse 89   Temp 99 ?F (37.2 ?C)   Resp 16   Ht 6\' 1"  (1.854 m)   Wt 99.8 kg   SpO2 98%   BMI 29.03 kg/m?  ?Physical Exam ?Vitals and nursing note reviewed.  ?Constitutional:   ?   General: He is not in acute distress. ?   Appearance: He is well-developed.  ?HENT:  ?   Head: Normocephalic and atraumatic.  ?Eyes:  ?   Conjunctiva/sclera: Conjunctivae normal.  ?Cardiovascular:  ?   Rate and Rhythm: Normal rate and regular rhythm.  ?   Heart sounds: No murmur heard. ?Pulmonary:  ?   Effort: Pulmonary effort is normal. No respiratory distress.  ?   Breath sounds: Normal breath sounds.  ?Abdominal:  ?   Palpations: Abdomen is soft.  ?   Tenderness: There is no abdominal tenderness.  ?Musculoskeletal:  ?   Cervical back: Neck supple.  ?   Comments: Swelling and tenderness to left ankle, maximal in the lateral malleoli region.  No gross deformity seen other than mild swelling.  Pain with attempted range of motion of the ankle.  No knee or medial/proximal tibia tenderness or deformity noted.  With palpation of the foot otherwise.  ?Skin: ?   General: Skin is warm  and dry.  ?   Capillary Refill: Capillary refill takes less than 2 seconds.  ?Neurological:  ?   Mental Status: He is alert.  ?Psychiatric:     ?   Mood and Affect: Mood normal.  ? ? ?ED Results / Procedures / Treatments   ?Labs ?(all labs ordered are listed, but only abnormal results are displayed) ?Labs Reviewed - No data to display ? ?EKG ?None ? ?Radiology ?DG Ankle Complete Left ? ?Result Date: 07/22/2021 ?CLINICAL DATA:  Trauma, fall, pain EXAM: LEFT ANKLE COMPLETE - 3+ VIEW COMPARISON:  None Available. FINDINGS: There is no evidence of fracture, dislocation, or joint effusion. There is no evidence of arthropathy or other focal bone abnormality. Soft tissues are unremarkable. IMPRESSION: No fracture or dislocation is seen in the left ankle.  Electronically Signed   By: Elmer Picker M.D.   On: 07/22/2021 18:09   ? ?Procedures ?[facility] Injury Treatment ? ?Date/Time: 07/22/2021 6:44 PM ?Performed by: Luna Fuse, MD ?Authorized by: Luna Fuse, MD  ?Post-procedure neurovascular assessment: post-procedure neurovascularly intact ?Comments: Patient refused air splint.  Requesting only Ace wrap.  Declined crutches as well. ? ?  ? ? ?Medications Ordered in ED ?Medications  ?acetaminophen (TYLENOL) tablet 650 mg (650 mg Oral Given 07/22/21 1742)  ?ibuprofen (ADVIL) tablet 600 mg (600 mg Oral Given 07/22/21 1742)  ? ? ?ED Course/ Medical Decision Making/ A&P ?  ?                        ?Medical Decision Making ?Amount and/or Complexity of Data Reviewed ?Radiology: ordered. ? ?Risk ?OTC drugs. ? ? ?Chart review shows office visit earlier today for ankle pain sent here to the ER for evaluation. ? ?X-rays unremarkable for acute fracture.  Patient placed in Aircast. ? ?Given crutch training, recommend outpatient follow-up with his doctor within the week.  Recommend repeat x-rays if he has persistent pain, otherwise recommend Tylenol Motrin for pain management at home. ? ? ? ? ? ? ? ?Final Clinical Impression(s) / ED Diagnoses ?Final diagnoses:  ?Acute left ankle pain  ? ? ?Rx / DC Orders ?ED Discharge Orders   ? ? None  ? ?  ? ? ?  ?Luna Fuse, MD ?07/22/21 1846 ? ?  ?Luna Fuse, MD ?07/22/21 [id number] ? ?

## 2021-07-22 NOTE — ED Notes (Signed)
Discharge paperwork given and understood. 

## 2022-05-14 DIAGNOSIS — R569 Unspecified convulsions: Secondary | ICD-10-CM | POA: Diagnosis not present

## 2022-05-14 DIAGNOSIS — F410 Panic disorder [episodic paroxysmal anxiety] without agoraphobia: Secondary | ICD-10-CM | POA: Diagnosis not present

## 2022-05-14 DIAGNOSIS — F411 Generalized anxiety disorder: Secondary | ICD-10-CM | POA: Diagnosis not present

## 2022-05-14 DIAGNOSIS — R030 Elevated blood-pressure reading, without diagnosis of hypertension: Secondary | ICD-10-CM | POA: Diagnosis not present

## 2022-05-14 DIAGNOSIS — R7989 Other specified abnormal findings of blood chemistry: Secondary | ICD-10-CM | POA: Diagnosis not present

## 2022-05-14 DIAGNOSIS — F331 Major depressive disorder, recurrent, moderate: Secondary | ICD-10-CM | POA: Diagnosis not present

## 2022-05-14 DIAGNOSIS — F900 Attention-deficit hyperactivity disorder, predominantly inattentive type: Secondary | ICD-10-CM | POA: Diagnosis not present

## 2022-05-14 DIAGNOSIS — F909 Attention-deficit hyperactivity disorder, unspecified type: Secondary | ICD-10-CM | POA: Diagnosis not present

## 2022-05-15 DIAGNOSIS — J039 Acute tonsillitis, unspecified: Secondary | ICD-10-CM | POA: Diagnosis not present

## 2023-04-10 ENCOUNTER — Telehealth: Payer: Self-pay | Admitting: Family Medicine

## 2023-04-10 ENCOUNTER — Ambulatory Visit: Payer: BC Managed Care – PPO | Admitting: Family Medicine

## 2023-04-10 ENCOUNTER — Encounter: Payer: Self-pay | Admitting: Family Medicine

## 2023-04-10 VITALS — BP 117/74 | HR 79 | Temp 98.7°F | Ht 73.0 in | Wt 244.0 lb

## 2023-04-10 DIAGNOSIS — F1990 Other psychoactive substance use, unspecified, uncomplicated: Secondary | ICD-10-CM | POA: Diagnosis not present

## 2023-04-10 DIAGNOSIS — F339 Major depressive disorder, recurrent, unspecified: Secondary | ICD-10-CM | POA: Diagnosis not present

## 2023-04-10 DIAGNOSIS — F419 Anxiety disorder, unspecified: Secondary | ICD-10-CM

## 2023-04-10 DIAGNOSIS — F900 Attention-deficit hyperactivity disorder, predominantly inattentive type: Secondary | ICD-10-CM

## 2023-04-10 DIAGNOSIS — E559 Vitamin D deficiency, unspecified: Secondary | ICD-10-CM | POA: Diagnosis not present

## 2023-04-10 DIAGNOSIS — Z136 Encounter for screening for cardiovascular disorders: Secondary | ICD-10-CM | POA: Diagnosis not present

## 2023-04-10 DIAGNOSIS — Z6832 Body mass index (BMI) 32.0-32.9, adult: Secondary | ICD-10-CM

## 2023-04-10 DIAGNOSIS — E66811 Obesity, class 1: Secondary | ICD-10-CM

## 2023-04-10 DIAGNOSIS — E6609 Other obesity due to excess calories: Secondary | ICD-10-CM

## 2023-04-10 DIAGNOSIS — F17299 Nicotine dependence, other tobacco product, with unspecified nicotine-induced disorders: Secondary | ICD-10-CM | POA: Diagnosis not present

## 2023-04-10 LAB — LIPID PANEL

## 2023-04-10 MED ORDER — VENLAFAXINE HCL ER 37.5 MG PO CP24: 37.5000 mg | ORAL_CAPSULE | Freq: Every day | ORAL | 1 refills | Status: DC

## 2023-04-10 MED ORDER — VENLAFAXINE HCL ER 37.5 MG PO CP24
37.5000 mg | ORAL_CAPSULE | Freq: Every day | ORAL | 1 refills | Status: DC
Start: 2023-04-10 — End: 2023-04-10

## 2023-04-10 NOTE — Telephone Encounter (Unsigned)
Copied from CRM [id number]. Topic: General - Other >> Apr 10, 2023  3:23 PM Clayton Bibles wrote: Reason for CRM:  He forgot to get a doctor's note for his work. Please call him when note is ready. He will give office a fax number to send it to (if you can)

## 2023-04-10 NOTE — Progress Notes (Signed)
Subjective:  Patient ID: Jose Rivera, male    DOB: 09/07/1990, 33 y.o.   MRN: [id number]  Patient Care Team: Arrie Senate, FNP as PCP - General (Family Medicine) Micki Riley, MD as Consulting Physician (Neurology)   Chief Complaint:  New Patient (Initial Visit) (Establish care/CPE/No acute problems)  HPI: Jose Rivera is a 33 y.o. male presenting on 04/10/2023 for New Patient (Initial Visit) (Establish care/CPE/No acute problems) Patient has not been established with PCP for some time.  States that he was seen recently at [facility] in [place], but would like a closer PCP.  His main concern today is Anxiety   Anxiety  States that 2024 was a "tough year". States that it is "all catching up to him". States that he used to see a [facility] provider last year and he was refilled on his klonipin, but he does not like taking it all the time because it makes him drowsy. States that he did not like lexapro. He tried it for one month. States that he has to be productive at work and did not like how it was making him feel. Reports that he has panic attacks, especially at work with sweating and palpitations. States that it happens once a month. Reports that he is "anxious about everything.. bills, life in general". States that he has trouble sleeping. Usually wakes up with on-going thoughts.  States that he has not taken adderrall in ~10 years. States that he was diagnosed with ADHD as a child. Endorses lack of focus and concentration especially at work. He would like to restart on adderall. He is interested in starting counseling but is worried about the cost.   Nicotine Dependence  States that he vapes Nicotine - 6 years.   Substance Use  Smokes marijuana once a week   Relevant past medical, surgical, family, and social history reviewed and updated as indicated.  Allergies and medications reviewed and updated. Data reviewed: Chart in Epic.  Past Medical History:   Diagnosis Date   ADD (attention deficit disorder)    Anxiety    Seizure (HCC) 2015   x 1, with normal EEG and normal brain MRI: no meds started by neurologist.  Also childhood hx of febrile seizures    Past Surgical History:  Procedure Laterality Date   APPENDECTOMY     COLON SURGERY     ? volvulus related to appendicitis? pt unable to recall or describe well but says some of colon was removed.    Social History   Socioeconomic History   Marital status: Single    Spouse name: Not on file   Number of children: 2   Years of education: 12th   Highest education level: Not on file  Occupational History   Occupation: Environmental education officer: [employer]  Tobacco Use   Smoking status: Every Day    Types: Cigarettes, E-cigarettes   Smokeless tobacco: Never  Vaping Use   Vaping status: Every Day   Substances: Nicotine, Flavoring  Substance and Sexual Activity   Alcohol use: No   Drug use: No    Types: Marijuana    Comment: 2 months   Sexual activity: Yes    Partners: Female  Other Topics Concern   Not on file  Social History Narrative   Patient lives at home with his mother.  Pt is single, has 2 sons, one of which lives with him.   Patient is right handed.  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   Patient drink sodas and sweet tea.   No T/A/Ds.   Social Drivers of Corporate investment banker Strain: Not on file  Food Insecurity: No Food Insecurity (04/10/2023)   Hunger Vital Sign    Worried About Running Out of Food in the Last Year: Never true    Ran Out of Food in the Last Year: Never true  Transportation Needs: No Transportation Needs (04/10/2023)   PRAPARE - Administrator, Civil Service (Medical): No    Lack of Transportation (Non-Medical): No  Physical Activity: Not on file  Stress: Not on file  Social Connections: Not on file  Intimate Partner Violence: Not At Risk (04/10/2023)   Humiliation,  Afraid, Rape, and Kick questionnaire    Fear of Current or Ex-Partner: No    Emotionally Abused: No    Physically Abused: No    Sexually Abused: No    Outpatient Encounter Medications as of 04/10/2023  Medication Sig   [DISCONTINUED] amoxicillin (AMOXIL) 500 MG capsule Take 2 capsules (1,000 mg total) by mouth 2 (two) times daily.   [DISCONTINUED] amphetamine-dextroamphetamine (ADDERALL) 30 MG tablet Take 1 tablet by mouth 2 (two) times daily.   [DISCONTINUED] clonazePAM (KLONOPIN) 1 MG tablet Take 1 tablet (1 mg total) by mouth 2 (two) times daily as needed.   [DISCONTINUED] ondansetron (ZOFRAN-ODT) 4 MG disintegrating tablet Take 1 tablet (4 mg total) by mouth every 8 (eight) hours as needed for nausea or vomiting.   [DISCONTINUED] oxyCODONE-acetaminophen (PERCOCET) 5-325 MG tablet Take 1 tablet by mouth every 4 (four) hours as needed for moderate pain.   No facility-administered encounter medications on file as of 04/10/2023.    No Known Allergies  Review of Systems As per HPI  Objective:  BP 117/74   Pulse 79   Temp 98.7 F (37.1 C)   Ht 6\' 1"  (1.854 m)   Wt 244 lb (110.7 kg)   SpO2 99%   BMI 32.19 kg/m    Wt Readings from Last 3 Encounters:  04/10/23 244 lb (110.7 kg)  07/22/21 220 lb (99.8 kg)  01/23/21 200 lb (90.7 kg)    Physical Exam Constitutional:      General: He is awake. He is not in acute distress.    Appearance: Normal appearance. He is well-developed and well-groomed. He is not ill-appearing, toxic-appearing or diaphoretic.  Cardiovascular:     Rate and Rhythm: Normal rate and regular rhythm.     Pulses: Normal pulses.          Radial pulses are 2+ on the right side and 2+ on the left side.       Posterior tibial pulses are 2+ on the right side and 2+ on the left side.     Heart sounds: Normal heart sounds. No murmur heard.    No gallop.  Pulmonary:     Effort: Pulmonary effort is normal. No respiratory distress.     Breath sounds: Normal breath  sounds. No stridor. No wheezing, rhonchi or rales.  Musculoskeletal:     Cervical back: Full passive range of motion without pain and neck supple.     Right lower leg: No edema.     Left lower leg: No edema.  Skin:    General: Skin is warm.     Capillary Refill: Capillary refill takes less than 2 seconds.  Neurological:     General: No focal deficit present.  Mental Status: He is alert, oriented to person, place, and time and easily aroused. Mental status is at baseline.     GCS: GCS eye subscore is 4. GCS verbal subscore is 5. GCS motor subscore is 6.     Motor: No weakness.  Psychiatric:        Attention and Perception: Attention and perception normal.        Mood and Affect: Affect normal. Mood is anxious.        Speech: Speech normal.        Behavior: Behavior normal. Behavior is cooperative.        Thought Content: Thought content normal. Thought content does not include homicidal or suicidal ideation. Thought content does not include homicidal or suicidal plan.        Cognition and Memory: Cognition and memory normal.        Judgment: Judgment normal.     Results for orders placed or performed during the hospital encounter of 06/02/21  Urinalysis, Routine w reflex microscopic Urine, Clean Catch   Collection Time: 06/02/21  9:51 AM  Result Value Ref Range   Color, Urine YELLOW YELLOW   APPearance CLEAR CLEAR   Specific Gravity, Urine 1.015 1.005 - 1.030   pH 7.5 5.0 - 8.0   Glucose, UA NEGATIVE NEGATIVE mg/dL   Hgb urine dipstick NEGATIVE NEGATIVE   Bilirubin Urine NEGATIVE NEGATIVE   Ketones, ur NEGATIVE NEGATIVE mg/dL   Protein, ur NEGATIVE NEGATIVE mg/dL   Nitrite NEGATIVE NEGATIVE   Leukocytes,Ua NEGATIVE NEGATIVE  Lipase, blood   Collection Time: 06/02/21 10:16 AM  Result Value Ref Range   Lipase 40 11 - 51 U/L  Comprehensive metabolic panel   Collection Time: 06/02/21 10:16 AM  Result Value Ref Range   Sodium 140 135 - 145 mmol/L   Potassium 4.5 3.5 - 5.1  mmol/L   Chloride 105 98 - 111 mmol/L   CO2 29 22 - 32 mmol/L   Glucose, Bld 90 70 - 99 mg/dL   BUN 15 6 - 20 mg/dL   Creatinine, Ser 2.95 (H) 0.61 - 1.24 mg/dL   Calcium 9.2 8.9 - 62.1 mg/dL   Total Protein 6.9 6.5 - 8.1 g/dL   Albumin 4.0 3.5 - 5.0 g/dL   AST 17 15 - 41 U/L   ALT 19 0 - 44 U/L   Alkaline Phosphatase 35 (L) 38 - 126 U/L   Total Bilirubin 0.7 0.3 - 1.2 mg/dL   GFR, Estimated >30 >86 mL/min   Anion gap 6 5 - 15  CBC   Collection Time: 06/02/21 10:16 AM  Result Value Ref Range   WBC 5.4 4.0 - 10.5 K/uL   RBC 4.84 4.22 - 5.81 MIL/uL   Hemoglobin 14.8 13.0 - 17.0 g/dL   HCT 57.8 46.9 - 62.9 %   MCV 90.1 80.0 - 100.0 fL   MCH 30.6 26.0 - 34.0 pg   MCHC 33.9 30.0 - 36.0 g/dL   RDW 52.8 41.3 - 24.4 %   Platelets 206 150 - 400 K/uL   nRBC 0.0 0.0 - 0.2 %       04/10/2023    1:11 PM  Depression screen PHQ 2/9  Decreased Interest 2  Down, Depressed, Hopeless 2  PHQ - 2 Score 4  Altered sleeping 2  Tired, decreased energy 2  Change in appetite 1  Feeling bad or failure about yourself  2  Trouble concentrating 2  Moving slowly or fidgety/restless 0  Suicidal thoughts 0  PHQ-9 Score 13  Difficult doing work/chores Somewhat difficult       04/10/2023    1:12 PM  GAD 7 : Generalized Anxiety Score  Nervous, Anxious, on Edge 2  Control/stop worrying 2  Worry too much - different things 2  Trouble relaxing 2  Restless 1  Easily annoyed or irritable 2  Afraid - awful might happen 0  Total GAD 7 Score 11   Pertinent labs & imaging results that were available during my care of the patient were reviewed by me and considered in my medical decision making.  Assessment & Plan:  Jose Rivera was seen today for new patient (initial visit).  Diagnoses and all orders for this visit:  1. Anxiety (Primary) Will start medication as below. Discussed with patient the side effects of medications. Denies SI. Safety contract established. Will place referrals as below for  psychiatry for management of ADHD given that patient has substance use as well. Referral for counseling with psychology. Patient expressed concern for cost and desire for virtual appts as he works 8-4:30 M-F.  - Ambulatory referral to Psychiatry - Ambulatory referral to Psychology - venlafaxine XR (EFFEXOR XR) 37.5 MG 24 hr capsule; Take 1 capsule (37.5 mg total) by mouth daily with breakfast.  Dispense: 30 capsule; Refill: 1  2. Depression, recurrent (HCC) As above  - Ambulatory referral to Psychiatry - Ambulatory referral to Psychology - venlafaxine XR (EFFEXOR XR) 37.5 MG 24 hr capsule; Take 1 capsule (37.5 mg total) by mouth daily with breakfast.  Dispense: 30 capsule; Refill: 1  3. Attention deficit hyperactivity disorder (ADHD), predominantly inattentive type As above  - Ambulatory referral to Psychiatry - Ambulatory referral to Psychology  4. Vitamin D deficiency Labs as below. Will communicate results to patient once available. Will await results to determine next steps.  - VITAMIN D 25 Hydroxy (Vit-D Deficiency, Fractures)  5. Substance use Labs as below. Will communicate results to patient once available. Will await results to determine next steps. Discussed reducing use of marijuana.  - CBC with Differential/Platelet - CMP14+EGFR  6. 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 Will communicate results to patient once available. Will await results to determine next steps.  Does not desire to quit at this time. Will continue to assess.  - CBC with Differential/Platelet - CMP14+EGFR  7. Encounter for screening for cardiovascular disorders Labs as below. Will communicate results to patient once available. Will await results to determine next steps.  Not fasting  - Lipid panel  8. BMI 32.0-32.9,adult Labs as above.   9. Class 1 obesity due to excess calories without serious comorbidity with body mass index (BMI) of 32.0 to 32.9  in adult Labs as below. Will communicate results to patient once available. Will await results to determine next steps.  - CBC with Differential/Platelet - CMP14+EGFR - TSH - VITAMIN D 25 Hydroxy (Vit-D Deficiency, Fractures)  Continue all other maintenance medications.  Follow up plan: Return in about 6 weeks (around 05/22/2023) for Chronic Condition Follow up.   Continue healthy lifestyle choices, including diet (rich in fruits, vegetables, and lean proteins, and low in salt and simple carbohydrates) and exercise (at least 30 minutes of moderate physical activity daily).  Written and verbal instructions provided   The above assessment and management plan was discussed with the patient. The patient verbalized understanding of and has agreed to the management plan. Patient is aware to call the clinic if they develop any new symptoms or if symptoms  persist or worsen. Patient is aware when to return to the clinic for a follow-up visit. Patient educated on when it is appropriate to go to the emergency department.   Neale Burly, DNP-FNP [facility] [address] [place], [place] [address] [phone]

## 2023-04-11 LAB — CMP14+EGFR
ALT: 14 IU/L (ref 0–44)
AST: 12 IU/L (ref 0–40)
Albumin: 4.9 g/dL (ref 4.1–5.1)
Alkaline Phosphatase: 49 IU/L (ref 44–121)
BUN/Creatinine Ratio: 13 (ref 9–20)
BUN: 14 mg/dL (ref 6–20)
Bilirubin Total: 0.7 mg/dL (ref 0.0–1.2)
CO2: 26 mmol/L (ref 20–29)
Calcium: 9.9 mg/dL (ref 8.7–10.2)
Chloride: 100 mmol/L (ref 96–106)
Creatinine, Ser: 1.05 mg/dL (ref 0.76–1.27)
Globulin, Total: 2.7 g/dL (ref 1.5–4.5)
Glucose: 97 mg/dL (ref 70–99)
Potassium: 4.2 mmol/L (ref 3.5–5.2)
Sodium: 140 mmol/L (ref 134–144)
Total Protein: 7.6 g/dL (ref 6.0–8.5)
eGFR: 97 mL/min/{1.73_m2} (ref >59)

## 2023-04-11 LAB — LIPID PANEL
Cholesterol, Total: 162 mg/dL (ref 100–199)
HDL: 36 mg/dL — ABNORMAL LOW (ref >39)
LDL CALC COMMENT:: 4.5 ratio (ref 0.0–5.0)
LDL Chol Calc (NIH): 106 mg/dL — ABNORMAL HIGH (ref 0–99)
Triglycerides: 109 mg/dL (ref 0–149)
VLDL Cholesterol Cal: 20 mg/dL (ref 5–40)

## 2023-04-11 LAB — CBC WITH DIFFERENTIAL/PLATELET
Basophils Absolute: 0 10*3/uL (ref 0.0–0.2)
Basos: 1 %
EOS (ABSOLUTE): 0.1 10*3/uL (ref 0.0–0.4)
Eos: 2 %
Hematocrit: 45.4 % (ref 37.5–51.0)
Hemoglobin: 14.6 g/dL (ref 13.0–17.7)
Immature Grans (Abs): 0 10*3/uL (ref 0.0–0.1)
Immature Granulocytes: 0 %
Lymphocytes Absolute: 1.5 10*3/uL (ref 0.7–3.1)
Lymphs: 29 %
MCH: 30.2 pg (ref 26.6–33.0)
MCHC: 32.2 g/dL (ref 31.5–35.7)
MCV: 94 fL (ref 79–97)
Monocytes Absolute: 0.3 10*3/uL (ref 0.1–0.9)
Monocytes: 6 %
Neutrophils Absolute: 3.3 10*3/uL (ref 1.4–7.0)
Neutrophils: 62 %
Platelets: 183 10*3/uL (ref 150–450)
RBC: 4.83 x10E6/uL (ref 4.14–5.80)
RDW: 12.6 % (ref 11.6–15.4)
WBC: 5.3 10*3/uL (ref 3.4–10.8)

## 2023-04-11 LAB — TSH: TSH: 1.26 u[IU]/mL (ref 0.450–4.500)

## 2023-04-11 LAB — VITAMIN D 25 HYDROXY (VIT D DEFICIENCY, FRACTURES): Vit D, 25-Hydroxy: 19.4 ng/mL — ABNORMAL LOW (ref 30.0–100.0)

## 2023-04-13 ENCOUNTER — Encounter: Payer: Self-pay | Admitting: Family Medicine

## 2023-04-13 NOTE — Telephone Encounter (Signed)
Called and spoke with patient. Let him know that the letter has been printed. Patient said he would get the fax # and call us back with the #.

## 2023-04-14 ENCOUNTER — Encounter: Payer: Self-pay | Admitting: Family Medicine

## 2023-04-14 MED ORDER — VITAMIN D (ERGOCALCIFEROL) 1.25 MG (50000 UNIT) PO CAPS: 50000.0000 [IU] | ORAL_CAPSULE | ORAL | 0 refills | Status: AC

## 2023-04-14 NOTE — Addendum Note (Signed)
Addended by: Neale Burly on: 04/14/2023 09:50 AM   Modules accepted: Orders

## 2023-04-14 NOTE — Progress Notes (Signed)
Slightly elevated cholesterol. Cholesterol is elevated. Diet encouraged - increase intake of fresh fruits and vegetables, increase intake of lean proteins. Bake, broil, or grill foods. Avoid fried, greasy, and fatty foods. Avoid fast foods. Increase intake of fiber-rich whole grains. Exercise encouraged - at least 150 minutes per week and advance as tolerated. Can also try red yeast rice and we will recheck in 3 -6 months.  Vitamin D level is low. I have sent in a weekly supplement to take for the next 12 weeks. After that, take a daily OTC vitamin D supplement with 1000-2000 IU. All other labs normal.  Patient has been approved to go to [facility]. Please provide information as below.  [facility] [address] [place], [place] [address] [phone]

## 2023-05-25 ENCOUNTER — Ambulatory Visit: Admitting: Family Medicine

## 2023-05-25 ENCOUNTER — Ambulatory Visit: Admitting: Nurse Practitioner

## 2023-05-25 ENCOUNTER — Encounter: Payer: Self-pay | Admitting: Family Medicine

## 2023-05-25 VITALS — BP 102/56 | HR 63 | Temp 97.5°F | Ht 73.0 in | Wt 238.0 lb

## 2023-05-25 DIAGNOSIS — J069 Acute upper respiratory infection, unspecified: Secondary | ICD-10-CM

## 2023-05-25 LAB — VERITOR FLU A/B WAIVED
Influenza A: NEGATIVE
Influenza B: NEGATIVE

## 2023-05-25 NOTE — Progress Notes (Signed)
 Subjective:  Patient ID: Jose Rivera, male    DOB: 1990/09/23  Age: 33 y.o. MRN: [id number]  CC: sick (Congestion, cough, headache, and fatigued. No fever. Yesterday Jose Rivera had upset stomach.)   HPI Jose Rivera presents for Patient presents with upper respiratory congestion. Rhinorrhea. No sore throat. Patient reports coughing frequently as well.  Minimal sputum noted. There is no fever, chills or sweats. The patient denies being short of breath. Onset was 3-5 days ago. Gradually worsening. Tried OTCs without improvement.      05/25/2023   11:34 AM 04/10/2023    1:11 PM  Depression screen PHQ 2/9  Decreased Interest 0 2  Down, Depressed, Hopeless 0 2  PHQ - 2 Score 0 4  Altered sleeping 0 2  Tired, decreased energy 0 2  Change in appetite 0 1  Feeling bad or failure about yourself  0 2  Trouble concentrating 0 2  Moving slowly or fidgety/restless 0 0  Suicidal thoughts 0 0  PHQ-9 Score 0 13  Difficult doing work/chores  Somewhat difficult    History Jose Rivera has a past medical history of ADD (attention deficit disorder), Anxiety, and Seizure (HCC) (2015).   Jose Rivera has a past surgical history that includes Appendectomy and Colon surgery.   His family history includes Alcohol abuse in his father.Jose Rivera reports that Jose Rivera has been smoking cigarettes and e-cigarettes. Jose Rivera has never used smokeless tobacco. Jose Rivera reports that Jose Rivera does not drink alcohol and does not use drugs.    ROS Review of Systems  Constitutional:  Negative for activity change, appetite change, chills and fever.  HENT:  Positive for congestion, postnasal drip, rhinorrhea and sinus pressure. Negative for ear discharge, ear pain, hearing loss, nosebleeds, sneezing and trouble swallowing.   Respiratory:  Negative for chest tightness and shortness of breath.   Cardiovascular:  Negative for chest pain and palpitations.  Skin:  Negative for rash.    Objective:  BP (!) 102/56   Pulse 63   Temp (!) 97.5 F (36.4 C)   Ht 6\' 1"   (1.854 m)   Wt 238 lb (108 kg)   SpO2 99%   BMI 31.40 kg/m   BP Readings from Last 3 Encounters:  05/25/23 (!) 102/56  04/10/23 117/74  07/22/21 134/67    Wt Readings from Last 3 Encounters:  05/25/23 238 lb (108 kg)  04/10/23 244 lb (110.7 kg)  07/22/21 220 lb (99.8 kg)     Physical Exam    Assessment & Plan:   Jose Rivera was seen today for sick.  Diagnoses and all orders for this visit:  Viral upper respiratory tract infection -     Veritor Flu A/B Waived       I am having Jose Rivera maintain his venlafaxine XR and Vitamin D (Ergocalciferol).  Allergies as of 05/25/2023   No Known Allergies      Medication List        Accurate as of May 25, 2023 11:49 AM. If you have any questions, ask your nurse or doctor.          venlafaxine XR 37.5 MG 24 hr capsule Commonly known as: Effexor XR Take 1 capsule (37.5 mg total) by mouth daily with breakfast.   Vitamin D (Ergocalciferol) 1.25 MG (50000 UNIT) Caps capsule Commonly known as: DRISDOL Take 1 capsule (50,000 Units total) by mouth every 7 (seven) days for 12 doses.         Follow-up: No follow-ups on file.  Mechele Claude,  M.D.

## 2023-06-06 ENCOUNTER — Other Ambulatory Visit: Payer: Self-pay | Admitting: Family Medicine

## 2023-06-06 DIAGNOSIS — F339 Major depressive disorder, recurrent, unspecified: Secondary | ICD-10-CM

## 2023-06-06 DIAGNOSIS — F419 Anxiety disorder, unspecified: Secondary | ICD-10-CM

## 2023-06-09 ENCOUNTER — Telehealth: Payer: Self-pay

## 2023-06-09 ENCOUNTER — Other Ambulatory Visit: Payer: Self-pay | Admitting: Family Medicine

## 2023-06-09 DIAGNOSIS — F419 Anxiety disorder, unspecified: Secondary | ICD-10-CM

## 2023-06-09 DIAGNOSIS — F339 Major depressive disorder, recurrent, unspecified: Secondary | ICD-10-CM

## 2023-06-09 NOTE — Telephone Encounter (Signed)
 Copied from CRM [id number]. Topic: Clinical - Medication Refill >> Jun 09, 2023 12:10 PM DeAngela L wrote: Most Recent Primary Care Visit:   Medication: venlafaxine XR (EFFEXOR-XR) 37.5 MG 24 hr capsule  Has the patient contacted their pharmacy? No (Agent: If no, request that the patient contact the pharmacy for the refill. If patient does not wish to contact the pharmacy document the reason why and proceed with request.) (Agent: If yes, when and what did the pharmacy advise?)  Is this the correct pharmacy for this prescription? Yes  If no, delete pharmacy and type the correct one.  This is the patient's preferred pharmacy:  [id number] - [place], [place] - [address] [place] [address] Phone: [phone] Fax: [phone]   Has the prescription been filled recently? Yes   Is the patient out of the medication? Yes   Has the patient been seen for an appointment in the last year OR does the patient have an upcoming appointment? Yes   Can we respond through MyChart? No   Agent: Please be advised that Rx refills may take up to 3 business days. We ask that you follow-up with your pharmacy.

## 2023-06-09 NOTE — Telephone Encounter (Signed)
 Pt notified that it was alrady called in ad to schedule appointment before further refills.

## 2023-06-09 NOTE — Telephone Encounter (Signed)
 Copied from CRM [id number]. Topic: Clinical - Prescription Issue >> Jun 09, 2023 12:16 PM DeAngela L wrote: Reason for CRM: Patient took last pill and also submitted a refill for medication venlafaxine XR (EFFEXOR-XR) 37.5 MG 24 hr capsule and this is a daily medication he takes and he is concerned about being at work all day tomorrow and out of medication for 3 days Please call back [phone]

## 2023-06-10 NOTE — Telephone Encounter (Signed)
 Duplicate request. Medication refilled on 06/08/23 and sent to patient's preferred pharmacy. Closing this encounter.

## 2023-07-07 ENCOUNTER — Encounter: Payer: Self-pay | Admitting: Family Medicine

## 2023-07-07 ENCOUNTER — Other Ambulatory Visit: Payer: Self-pay | Admitting: Family Medicine

## 2023-07-07 DIAGNOSIS — F339 Major depressive disorder, recurrent, unspecified: Secondary | ICD-10-CM

## 2023-07-07 DIAGNOSIS — F419 Anxiety disorder, unspecified: Secondary | ICD-10-CM

## 2023-07-07 NOTE — Telephone Encounter (Signed)
Called pt to schedule appt no answer no voicemail Letter mailed

## 2023-07-07 NOTE — Telephone Encounter (Signed)
 Jose Rivera pt NTBS 30-d given 06/08/23

## 2023-07-10 ENCOUNTER — Telehealth: Payer: Self-pay

## 2023-07-10 ENCOUNTER — Other Ambulatory Visit: Payer: Self-pay | Admitting: Family Medicine

## 2023-07-10 DIAGNOSIS — F339 Major depressive disorder, recurrent, unspecified: Secondary | ICD-10-CM

## 2023-07-10 DIAGNOSIS — F419 Anxiety disorder, unspecified: Secondary | ICD-10-CM

## 2023-07-10 MED ORDER — VENLAFAXINE HCL ER 37.5 MG PO CP24: 37.5000 mg | ORAL_CAPSULE | Freq: Every day | ORAL | 0 refills | Status: DC

## 2023-07-10 NOTE — Telephone Encounter (Signed)
 FYI- appointment scheduled for 08/05/23, per chart.

## 2023-07-10 NOTE — Telephone Encounter (Signed)
 Copied from CRM [id number]. Topic: Clinical - Medication Refill >> Jul 10, 2023  9:32 AM Elle L wrote: Most Recent Primary Care Visit:   Medication: venlafaxine  XR (EFFEXOR -XR) 37.5 MG 24 hr capsule  Has the patient contacted their pharmacy? Yes  Is this the correct pharmacy for this prescription? Yes  This is the patient's preferred pharmacy:  [id number] - [place], [place] - [address] [place] [address] Phone: [phone] Fax: [phone]  Has the prescription been filled recently? No  Is the patient out of the medication? Yes, 1 day left.   Has the patient been seen for an appointment in the last year OR does the patient have an upcoming appointment? Yes, the patient scheduled for the soonest available that he is able to take off of work. He was advised previously that he needs to be seen before getting a refill but is requesting to see if he can have it refilled since he is scheduled. He is requesting a call back to know if the refill was approved or denied.   Can we respond through MyChart? No  Agent: Please be advised that Rx refills may take up to 3 business days. We ask that you follow-up with your pharmacy.

## 2023-07-10 NOTE — Telephone Encounter (Signed)
 Copied from CRM [id number]. Topic: Clinical - Prescription Issue >> Jul 10, 2023  2:44 PM Everette C wrote: Reason for CRM: The patient has called to follow up with a member of staff on their previously submitted refill request for venlafaxine  XR (EFFEXOR -XR) 37.5 MG 24 hr capsule [[id number]]  The patient would like to know when they'll be able to pick up their prescription   Please contact the patient at [phone] when possible

## 2023-07-14 ENCOUNTER — Ambulatory Visit: Admitting: Nurse Practitioner

## 2023-07-14 ENCOUNTER — Encounter: Payer: Self-pay | Admitting: Nurse Practitioner

## 2023-07-14 VITALS — BP 103/66 | HR 71 | Temp 98.1°F | Ht 73.0 in | Wt 226.0 lb

## 2023-07-14 DIAGNOSIS — B950 Streptococcus, group A, as the cause of diseases classified elsewhere: Secondary | ICD-10-CM | POA: Diagnosis not present

## 2023-07-14 DIAGNOSIS — J029 Acute pharyngitis, unspecified: Secondary | ICD-10-CM | POA: Insufficient documentation

## 2023-07-14 LAB — RAPID STREP SCREEN (MED CTR MEBANE ONLY): Strep Gp A Ag, IA W/Reflex: POSITIVE — AB

## 2023-07-14 MED ORDER — AMOXICILLIN 875 MG PO TABS: 875.0000 mg | ORAL_TABLET | Freq: Two times a day (BID) | ORAL | 0 refills | Status: DC

## 2023-07-14 NOTE — Progress Notes (Signed)
 Acute Office Visit  Subjective:     Patient ID: Jose Rivera, male    DOB: Apr 15, 1990, 33 y.o.   MRN: [id number]  Chief Complaint  Patient presents with   Sore Throat    Sore throat started today, wife tested positive for strep   Jose Rivera 33 year old male present July 14, 2023 for an acute visit concern for strep complains of sore throat for 1 days "wife tested positive strep". He denies a history of chest pain, dizziness, fevers, vomiting, wheezing, and cough and denies a history of asthma. Patient admits to vape.   Active Ambulatory Problems    Diagnosis Date Noted   Seizure [facility])    ADHD (attention deficit hyperactivity disorder) 12/27/2013   Anxiety state 06/19/2014   Streptococcal infection group A 07/14/2023   Sore throat 07/14/2023   Resolved Ambulatory Problems    Diagnosis Date Noted   No Resolved Ambulatory Problems   Past Medical History:  Diagnosis Date   ADD (attention deficit disorder)    Anxiety      Review of Systems  Constitutional:  Negative for chills and fever.  HENT:  Positive for sore throat.   Respiratory:  Negative for cough and shortness of breath.   Cardiovascular:  Negative for chest pain and leg swelling.  Skin:  Negative for itching and rash.  Neurological:  Negative for dizziness and headaches.   Negative unless indicated in HPI     Objective:    BP 103/66   Pulse 71   Temp 98.1 F (36.7 C) (Temporal)   Ht 6\' 1"  (1.854 m)   Wt 226 lb (102.5 kg)   SpO2 98%   BMI 29.82 kg/m  BP Readings from Last 3 Encounters:  07/14/23 103/66  05/25/23 (!) 102/56  04/10/23 117/74   Wt Readings from Last 3 Encounters:  07/14/23 226 lb (102.5 kg)  05/25/23 238 lb (108 kg)  04/10/23 244 lb (110.7 kg)      Physical Exam Vitals and nursing note reviewed.  Constitutional:      General: He is not in acute distress. HENT:     Head: Normocephalic and atraumatic.     Right Ear: Tympanic membrane, ear canal and external ear normal.  There is no impacted cerumen.     Left Ear: Tympanic membrane, ear canal and external ear normal. There is no impacted cerumen.     Nose: Nose normal.     Mouth/Throat:     Lips: Pink.     Mouth: Mucous membranes are moist.     Pharynx: Uvula midline. Postnasal drip present.     Tonsils: Tonsillar exudate present. 2+ on the right. 2+ on the left.  Eyes:     Extraocular Movements: Extraocular movements intact.     Conjunctiva/sclera: Conjunctivae normal.     Pupils: Pupils are equal, round, and reactive to light.  Cardiovascular:     Heart sounds: Normal heart sounds.  Pulmonary:     Effort: Pulmonary effort is normal.     Breath sounds: Normal breath sounds.  Musculoskeletal:        General: Normal range of motion.     Right lower leg: No edema.     Left lower leg: No edema.  Skin:    General: Skin is warm and dry.     Findings: No rash.  Neurological:     Mental Status: He is alert and oriented to person, place, and time.     Gait: Gait is intact.  Psychiatric:        Mood and Affect: Mood normal.        Behavior: Behavior normal.        Thought Content: Thought content normal.        Judgment: Judgment normal.     No results found for any visits on 07/14/23.      Assessment & Plan:  Sore throat -     Rapid Strep Screen ([facility] ONLY) -     Culture, Group A Strep -     Amoxicillin ; Take 1 tablet (875 mg total) by mouth 2 (two) times daily.  Dispense: 20 tablet; Refill: 0  Streptococcal infection group A -     Amoxicillin ; Take 1 tablet (875 mg total) by mouth 2 (two) times daily.  Dispense: 20 tablet; Refill: 0   Jose Rivera is a 33 year old Caucasian male seen today for strep, no acute distress Strep a will treat with amoxicillin  875 twice daily for 10 days Return to work letter provided Symptomatic therapy suggested: push fluids, rest, use acetaminophen , ibuprofen  prn, and return office visit prn if symptoms persist or worsen. Call or return to clinic prn if  these symptoms worsen or fail to improve as anticipated.    The above assessment and management plan was discussed with the patient. The patient verbalized understanding of and has agreed to the management plan. Patient is aware to call the clinic if they develop any new symptoms or if symptoms persist or worsen. Patient is aware when to return to the clinic for a follow-up visit. Patient educated on when it is appropriate to go to the emergency department.  Return if symptoms worsen or fail to improve.  Cortney Beissel [place] Thompson, DNP [facility] [address] [place], [place] [address] [phone]  Note: This document was prepared by [employer] voice dictation technology and any errors that results from this process are unintentional.

## 2023-07-16 ENCOUNTER — Ambulatory Visit: Admitting: Family Medicine

## 2023-07-23 ENCOUNTER — Ambulatory Visit: Admitting: Family Medicine

## 2023-07-23 ENCOUNTER — Encounter: Payer: Self-pay | Admitting: Family Medicine

## 2023-07-23 VITALS — BP 115/66 | HR 80 | Temp 98.3°F | Ht 73.0 in | Wt 232.0 lb

## 2023-07-23 DIAGNOSIS — F17299 Nicotine dependence, other tobacco product, with unspecified nicotine-induced disorders: Secondary | ICD-10-CM

## 2023-07-23 DIAGNOSIS — E559 Vitamin D deficiency, unspecified: Secondary | ICD-10-CM | POA: Insufficient documentation

## 2023-07-23 DIAGNOSIS — F1990 Other psychoactive substance use, unspecified, uncomplicated: Secondary | ICD-10-CM | POA: Insufficient documentation

## 2023-07-23 DIAGNOSIS — Z6832 Body mass index (BMI) 32.0-32.9, adult: Secondary | ICD-10-CM

## 2023-07-23 DIAGNOSIS — F339 Major depressive disorder, recurrent, unspecified: Secondary | ICD-10-CM | POA: Insufficient documentation

## 2023-07-23 DIAGNOSIS — F17200 Nicotine dependence, unspecified, uncomplicated: Secondary | ICD-10-CM | POA: Insufficient documentation

## 2023-07-23 DIAGNOSIS — E6609 Other obesity due to excess calories: Secondary | ICD-10-CM | POA: Insufficient documentation

## 2023-07-23 DIAGNOSIS — E66811 Obesity, class 1: Secondary | ICD-10-CM

## 2023-07-23 DIAGNOSIS — F419 Anxiety disorder, unspecified: Secondary | ICD-10-CM | POA: Diagnosis not present

## 2023-07-23 MED ORDER — VENLAFAXINE HCL ER 37.5 MG PO CP24: 37.5000 mg | ORAL_CAPSULE | Freq: Every day | ORAL | 1 refills | Status: AC

## 2023-07-23 NOTE — Progress Notes (Signed)
 Subjective:  Patient ID: Jose Rivera, male    DOB: 1990/04/09, 33 y.o.   MRN: [id number]  Patient Care Team: Chrystine Crate, FNP as PCP - General (Family Medicine) Lisabeth Rider, MD as Consulting Physician (Neurology)   Chief Complaint:  Medical Management of Chronic Issues   HPI: Jose Rivera is a 33 y.o. male presenting on 07/23/2023 for Medical Management of Chronic Issues  HPI 1. Anxiety/2. Depression, recurrent (HCC) States that he likes the dose where it is at. He denies thoughts of self harm. Denies any past attempts at self harm. He does not wish to seek counseling. He would like to continue medication. Denies side effects.   3. Vitamin D  deficiency States that he is not taking any vitamins. Reports that he had side effects from multivitamin.   4. Substance use Continues to smoke marijuana a few times per week.  Continues to vape, daily.   5. Class 1 obesity due to excess calories without serious comorbidity with body mass index (BMI) of 32.0 to 32.9 in adult States that he is losing some weight with the stress of everything. He has started eating a little better, limiting fast food and watching portions.    Relevant past medical, surgical, family, and social history reviewed and updated as indicated.  Allergies and medications reviewed and updated. Data reviewed: Chart in Epic.   Past Medical History:  Diagnosis Date   ADD (attention deficit disorder)    Anxiety    Seizure (HCC) 2015   x 1, with normal EEG and normal brain MRI: no meds started by neurologist.  Also childhood hx of febrile seizures    Past Surgical History:  Procedure Laterality Date   APPENDECTOMY     COLON SURGERY     ? volvulus related to appendicitis? pt unable to recall or describe well but says some of colon was removed.    Social History   Socioeconomic History   Marital status: Single    Spouse name: Not on file   Number of children: 2   Years of education: 12th    Highest education level: Not on file  Occupational History   Occupation: Environmental education officer: [employer]  Tobacco Use   Smoking status: Every Day    Types: Cigarettes, E-cigarettes   Smokeless tobacco: Never  Vaping Use   Vaping status: Every Day   Substances: Nicotine, Flavoring  Substance and Sexual Activity   Alcohol use: No   Drug use: No    Types: Marijuana    Comment: 2 months   Sexual activity: Yes    Partners: Female  Other Topics Concern   Not on file  Social History Narrative   Patient lives at home with his mother.  Pt is single, has 2 sons, one of which lives with him.   Patient is right handed.   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   Patient drink sodas and sweet tea.   No T/A/Ds.   Social Drivers of Corporate investment banker Strain: Not on file  Food Insecurity: No Food Insecurity (04/10/2023)   Hunger Vital Sign    Worried About Running Out of Food in the Last Year: Never true    Ran Out of Food in the Last Year: Never true  Transportation Needs: No Transportation Needs (04/10/2023)   PRAPARE - Administrator, Civil Service (Medical): No  Lack of Transportation (Non-Medical): No  Physical Activity: Not on file  Stress: Not on file  Social Connections: Not on file  Intimate Partner Violence: Not At Risk (04/10/2023)   Humiliation, Afraid, Rape, and Kick questionnaire    Fear of Current or Ex-Partner: No    Emotionally Abused: No    Physically Abused: No    Sexually Abused: No    Outpatient Encounter Medications as of 07/23/2023  Medication Sig   venlafaxine  XR (EFFEXOR -XR) 37.5 MG 24 hr capsule Take 1 capsule (37.5 mg total) by mouth daily with breakfast. **NEEDS TO BE SEEN BEFORE NEXT REFILL**   [DISCONTINUED] amoxicillin  (AMOXIL ) 875 MG tablet Take 1 tablet (875 mg total) by mouth 2 (two) times daily.   No facility-administered encounter medications on file as of 07/23/2023.     No Known Allergies  Review of Systems As per HPI  Objective:  BP 115/66   Pulse 80   Temp 98.3 F (36.8 C)   Ht 6\' 1"  (1.854 m)   Wt 232 lb (105.2 kg)   SpO2 96%   BMI 30.61 kg/m    Wt Readings from Last 3 Encounters:  07/23/23 232 lb (105.2 kg)  07/14/23 226 lb (102.5 kg)  05/25/23 238 lb (108 kg)    Physical Exam Constitutional:      General: He is awake. He is not in acute distress.    Appearance: Normal appearance. He is well-developed and well-groomed. He is obese. He is not ill-appearing, toxic-appearing or diaphoretic.  Cardiovascular:     Rate and Rhythm: Normal rate and regular rhythm.     Heart sounds: Normal heart sounds. No murmur heard.    No gallop.  Pulmonary:     Effort: Pulmonary effort is normal. No respiratory distress.     Breath sounds: Normal breath sounds. No stridor. No wheezing, rhonchi or rales.  Musculoskeletal:     Cervical back: Full passive range of motion without pain and neck supple.  Skin:    General: Skin is warm.     Capillary Refill: Capillary refill takes less than 2 seconds.  Neurological:     General: No focal deficit present.     Mental Status: He is alert, oriented to person, place, and time and easily aroused. Mental status is at baseline.     GCS: GCS eye subscore is 4. GCS verbal subscore is 5. GCS motor subscore is 6.     Motor: No weakness.  Psychiatric:        Attention and Perception: Attention and perception normal.        Mood and Affect: Mood and affect normal.        Speech: Speech normal.        Behavior: Behavior normal. Behavior is cooperative.        Thought Content: Thought content normal. Thought content does not include homicidal or suicidal ideation. Thought content does not include homicidal or suicidal plan.        Cognition and Memory: Cognition and memory normal.        Judgment: Judgment normal.      Results for orders placed or performed in visit on 07/14/23  Rapid Strep Screen ([facility])   Collection Time: 07/14/23  2:07 PM   Specimen: Other   Other  Result Value Ref Range   Strep Gp A Ag, IA W/Reflex Positive (A) Negative       05/25/2023   11:34 AM 04/10/2023    1:11 PM  Depression screen PHQ 2/9  Decreased Interest 0 2  Down, Depressed, Hopeless 0 2  PHQ - 2 Score 0 4  Altered sleeping 0 2  Tired, decreased energy 0 2  Change in appetite 0 1  Feeling bad or failure about yourself  0 2  Trouble concentrating 0 2  Moving slowly or fidgety/restless 0 0  Suicidal thoughts 0 0  PHQ-9 Score 0 13  Difficult doing work/chores  Somewhat difficult       05/25/2023   11:34 AM 04/10/2023    1:12 PM  GAD 7 : Generalized Anxiety Score  Nervous, Anxious, on Edge 0 2  Control/stop worrying 0 2  Worry too much - different things 0 2  Trouble relaxing 0 2  Restless 0 1  Easily annoyed or irritable 0 2  Afraid - awful might happen 0 0  Total GAD 7 Score 0 11  Anxiety Difficulty Not difficult at all    Pertinent labs & imaging results that were available during my care of the patient were reviewed by me and considered in my medical decision making.  Assessment & Plan:  Kayven was seen today for medical management of chronic issues.  Diagnoses and all orders for this visit:  1. Anxiety (Primary) Well controlled. Continue current regimen.  Denies SI. Declines counseling. Refill provided.  - venlafaxine  XR (EFFEXOR -XR) 37.5 MG 24 hr capsule; Take 1 capsule (37.5 mg total) by mouth daily with breakfast.  Dispense: 90 capsule; Refill: 1  2. Depression, recurrent (HCC) As above.  - venlafaxine  XR (EFFEXOR -XR) 37.5 MG 24 hr capsule; Take 1 capsule (37.5 mg total) by mouth daily with breakfast.  Dispense: 90 capsule; Refill: 1  3. Vitamin D  deficiency Encouraged patient to purchase vitamin D  only to mitigate side effects. Will repeat at follow up.   4. Substance use Encouraged cessation. Dicussed with patient that it can impact his medications.   5.  Class 1 obesity due to excess calories without serious comorbidity with body mass index (BMI) of 32.0 to 32.9 in adult Discussed with patient to continue healthy lifestyle choices, including diet (rich in fruits, vegetables, and lean proteins, and low in salt and simple carbohydrates) and exercise (at least 30 minutes of moderate physical activity daily). Limit beverages high is sugar. Recommended at least 80-100 oz of water daily.   6. 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   Continue all other maintenance medications.  Follow up plan: Return in about 6 months (around 01/23/2024).   Continue healthy lifestyle choices, including diet (rich in fruits, vegetables, and lean proteins, and low in salt and simple carbohydrates) and exercise (at least 30 minutes of moderate physical activity daily).  Written and verbal instructions provided   The above assessment and management plan was discussed with the patient. The patient verbalized understanding of and has agreed to the management plan. Patient is aware to call the clinic if they develop any new symptoms or if symptoms persist or worsen. Patient is aware when to return to the clinic for a follow-up visit. Patient educated on when it is appropriate to go to the emergency department.   Jacqualyn Mates, DNP-FNP [facility] [address] [place], [place] [address] [phone]

## 2023-08-05 ENCOUNTER — Ambulatory Visit: Admitting: Family Medicine

## 2023-08-05 ENCOUNTER — Encounter: Payer: Self-pay | Admitting: Family Medicine

## 2023-08-05 ENCOUNTER — Ambulatory Visit: Payer: Self-pay | Admitting: Family Medicine

## 2023-08-05 VITALS — BP 97/64 | HR 67 | Temp 97.8°F | Ht 73.0 in | Wt 229.0 lb

## 2023-08-05 DIAGNOSIS — R051 Acute cough: Secondary | ICD-10-CM

## 2023-08-05 DIAGNOSIS — J029 Acute pharyngitis, unspecified: Secondary | ICD-10-CM

## 2023-08-05 DIAGNOSIS — J020 Streptococcal pharyngitis: Secondary | ICD-10-CM | POA: Diagnosis not present

## 2023-08-05 LAB — RAPID STREP SCREEN (MED CTR MEBANE ONLY): Strep Gp A Ag, IA W/Reflex: POSITIVE — AB

## 2023-08-05 MED ORDER — CEFDINIR 300 MG PO CAPS: 600.0000 mg | ORAL_CAPSULE | Freq: Every day | ORAL | 0 refills | Status: DC

## 2023-08-05 MED ORDER — PROMETHAZINE-DM 6.25-15 MG/5ML PO SYRP: 5.0000 mL | ORAL_SOLUTION | Freq: Four times a day (QID) | ORAL | 0 refills | Status: DC | PRN

## 2023-08-05 NOTE — Progress Notes (Signed)
 Discussed with patient in office. Strongly encourage completing course of antibiotics.

## 2023-08-05 NOTE — Progress Notes (Signed)
 Subjective:  Patient ID: Jose Rivera, male    DOB: Jul 06, 1990, 33 y.o.   MRN: [id number]  Patient Care Team: Chrystine Crate, FNP as PCP - General (Family Medicine) Lisabeth Rider, MD as Consulting Physician (Neurology)   Chief Complaint:  cold, congestion, headache (X 1 days/)   HPI: Jose Rivera is a 33 y.o. male presenting on 08/05/2023 for cold, congestion, headache (X 1 days/)  HPI 1. Sore throat States that symptoms started yesterday. Reports fatigue, headache, sore throat, congestion. Reports that right ear was hurting some yesterday, but has improved. Reports cough. Denies fever, N/V. Has not tried anything OTC.  Of note, patient was positive for strep 4/29 and admits that he did not complete abx.    Relevant past medical, surgical, family, and social history reviewed and updated as indicated.  Allergies and medications reviewed and updated. Data reviewed: Chart in Epic.   Past Medical History:  Diagnosis Date   ADD (attention deficit disorder)    Anxiety    Seizure (HCC) 2015   x 1, with normal EEG and normal brain MRI: no meds started by neurologist.  Also childhood hx of febrile seizures    Past Surgical History:  Procedure Laterality Date   APPENDECTOMY     COLON SURGERY     ? volvulus related to appendicitis? pt unable to recall or describe well but says some of colon was removed.    Social History   Socioeconomic History   Marital status: Single    Spouse name: Not on file   Number of children: 2   Years of education: 12th   Highest education level: Not on file  Occupational History   Occupation: Environmental education officer: [employer]  Tobacco Use   Smoking status: Every Day    Types: Cigarettes, E-cigarettes   Smokeless tobacco: Never  Vaping Use   Vaping status: Every Day   Substances: Nicotine, Flavoring  Substance and Sexual Activity   Alcohol use: No   Drug use: No    Types: Marijuana    Comment: 2 months    Sexual activity: Yes    Partners: Female  Other Topics Concern   Not on file  Social History Narrative   Patient lives at home with his mother.  Pt is single, has 2 sons, one of which lives with him.   Patient is right handed.   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   Patient drink sodas and sweet tea.   No T/A/Ds.   Social Drivers of Corporate investment banker Strain: Not on file  Food Insecurity: No Food Insecurity (04/10/2023)   Hunger Vital Sign    Worried About Running Out of Food in the Last Year: Never true    Ran Out of Food in the Last Year: Never true  Transportation Needs: No Transportation Needs (04/10/2023)   PRAPARE - Administrator, Civil Service (Medical): No    Lack of Transportation (Non-Medical): No  Physical Activity: Not on file  Stress: Not on file  Social Connections: Not on file  Intimate Partner Violence: Not At Risk (04/10/2023)   Humiliation, Afraid, Rape, and Kick questionnaire    Fear of Current or Ex-Partner: No    Emotionally Abused: No    Physically Abused: No    Sexually Abused: No    Outpatient Encounter Medications as of 08/05/2023  Medication Sig   venlafaxine   XR (EFFEXOR -XR) 37.5 MG 24 hr capsule Take 1 capsule (37.5 mg total) by mouth daily with breakfast.   No facility-administered encounter medications on file as of 08/05/2023.   No Known Allergies  Review of Systems As per HPI  Objective:  BP 97/64   Pulse 67   Temp 97.8 F (36.6 C)   Ht 6\' 1"  (1.854 m)   Wt 229 lb (103.9 kg)   SpO2 99%   BMI 30.21 kg/m    Wt Readings from Last 3 Encounters:  08/05/23 229 lb (103.9 kg)  07/23/23 232 lb (105.2 kg)  07/14/23 226 lb (102.5 kg)   Physical Exam Constitutional:      General: He is awake. He is not in acute distress.    Appearance: Normal appearance. He is well-developed and well-groomed. He is ill-appearing.  HENT:     Right Ear: A middle ear effusion is present. Tympanic  membrane is not injected, scarred, perforated, erythematous or retracted.     Left Ear: A middle ear effusion is present. Tympanic membrane is not injected, scarred, perforated, erythematous or retracted.     Nose: Congestion and rhinorrhea present. Rhinorrhea is clear.     Right Sinus: No maxillary sinus tenderness or frontal sinus tenderness.     Left Sinus: No maxillary sinus tenderness or frontal sinus tenderness.     Mouth/Throat:     Lips: Pink. No lesions.     Mouth: Mucous membranes are moist.     Pharynx: Posterior oropharyngeal erythema present. No oropharyngeal exudate or postnasal drip.     Tonsils: No tonsillar exudate or tonsillar abscesses.  Cardiovascular:     Rate and Rhythm: Normal rate and regular rhythm.     Heart sounds: Normal heart sounds.  Pulmonary:     Effort: Pulmonary effort is normal.     Breath sounds: Normal breath sounds.  Lymphadenopathy:     Head:     Right side of head: No submental, submandibular, tonsillar or preauricular adenopathy.     Left side of head: No submental, submandibular, tonsillar or preauricular adenopathy.     Cervical:     Right cervical: No superficial cervical adenopathy.    Left cervical: No superficial cervical adenopathy.  Skin:    General: Skin is warm.     Capillary Refill: Capillary refill takes less than 2 seconds.  Neurological:     General: No focal deficit present.     Mental Status: He is alert, oriented to person, place, and time and easily aroused. Mental status is at baseline.  Psychiatric:        Attention and Perception: Attention and perception normal.        Mood and Affect: Mood and affect normal.        Speech: Speech normal.        Behavior: Behavior normal. Behavior is cooperative.        Thought Content: Thought content normal.        Cognition and Memory: Cognition and memory normal.        Judgment: Judgment normal.     Results for orders placed or performed in visit on 07/14/23  Rapid Strep  Screen ([facility] ONLY)   Collection Time: 07/14/23  2:07 PM   Specimen: Other   Other  Result Value Ref Range   Strep Gp A Ag, IA W/Reflex Positive (A) Negative       05/25/2023   11:34 AM 04/10/2023    1:11 PM  Depression screen PHQ  2/9  Decreased Interest 0 2  Down, Depressed, Hopeless 0 2  PHQ - 2 Score 0 4  Altered sleeping 0 2  Tired, decreased energy 0 2  Change in appetite 0 1  Feeling bad or failure about yourself  0 2  Trouble concentrating 0 2  Moving slowly or fidgety/restless 0 0  Suicidal thoughts 0 0  PHQ-9 Score 0 13  Difficult doing work/chores  Somewhat difficult      05/25/2023   11:34 AM 04/10/2023    1:12 PM  GAD 7 : Generalized Anxiety Score  Nervous, Anxious, on Edge 0 2  Control/stop worrying 0 2  Worry too much - different things 0 2  Trouble relaxing 0 2  Restless 0 1  Easily annoyed or irritable 0 2  Afraid - awful might happen 0 0  Total GAD 7 Score 0 11  Anxiety Difficulty Not difficult at all    Pertinent labs & imaging results that were available during my care of the patient were reviewed by me and considered in my medical decision making.  Assessment & Plan:  Jose Rivera was seen today for cold, congestion, headache.  Diagnoses and all orders for this visit:  1. Sore throat Positive for strep. Abx as below. Stressed to patient the importance of completing abx course.  - Rapid Strep Screen ([facility] ONLY) - Culture, Group A Strep; Future - promethazine-dextromethorphan (PROMETHAZINE-DM) 6.25-15 MG/5ML syrup; Take 5 mLs by mouth 4 (four) times daily as needed.  Dispense: 118 mL; Refill: 0 - cefdinir (OMNICEF) 300 MG capsule; Take 2 capsules (600 mg total) by mouth daily.  Dispense: 20 capsule; Refill: 0  2. Strep pharyngitis (Primary) As above.  - Rapid Strep Screen ([facility] ONLY) - Culture, Group A Strep; Future - promethazine-dextromethorphan (PROMETHAZINE-DM) 6.25-15 MG/5ML syrup; Take 5 mLs by mouth 4 (four) times  daily as needed.  Dispense: 118 mL; Refill: 0 - cefdinir (OMNICEF) 300 MG capsule; Take 2 capsules (600 mg total) by mouth daily.  Dispense: 20 capsule; Refill: 0  3. Acute cough Provided below for symptom management.  - promethazine-dextromethorphan (PROMETHAZINE-DM) 6.25-15 MG/5ML syrup; Take 5 mLs by mouth 4 (four) times daily as needed.  Dispense: 118 mL; Refill: 0  Continue all other maintenance medications.  Follow up plan: Return if symptoms worsen or fail to improve.   Continue healthy lifestyle choices, including diet (rich in fruits, vegetables, and lean proteins, and low in salt and simple carbohydrates) and exercise (at least 30 minutes of moderate physical activity daily).  Written and verbal instructions provided   The above assessment and management plan was discussed with the patient. The patient verbalized understanding of and has agreed to the management plan. Patient is aware to call the clinic if they develop any new symptoms or if symptoms persist or worsen. Patient is aware when to return to the clinic for a follow-up visit. Patient educated on when it is appropriate to go to the emergency department.   Jacqualyn Mates, DNP-FNP [facility] [address] [place], [place] [address] [phone]

## 2023-08-24 ENCOUNTER — Ambulatory Visit: Admitting: Family Medicine

## 2023-08-24 ENCOUNTER — Encounter: Payer: Self-pay | Admitting: Family Medicine

## 2023-08-24 VITALS — BP 106/68 | HR 64 | Temp 97.9°F | Ht 73.0 in | Wt 228.0 lb

## 2023-08-24 DIAGNOSIS — J0100 Acute maxillary sinusitis, unspecified: Secondary | ICD-10-CM | POA: Diagnosis not present

## 2023-08-24 MED ORDER — AMOXICILLIN-POT CLAVULANATE 875-125 MG PO TABS: 1.0000 | ORAL_TABLET | Freq: Two times a day (BID) | ORAL | 0 refills | Status: AC

## 2023-08-24 MED ORDER — PREDNISONE 10 MG PO TABS: ORAL_TABLET | ORAL | 0 refills | Status: AC

## 2023-08-24 NOTE — Progress Notes (Signed)
 6U

## 2023-08-24 NOTE — Progress Notes (Signed)
 Subjective:  Patient ID: Archibald Kobus, male    DOB: May 26, 1990  Age: 33 y.o. MRN: [id number]  CC: Headache (Pt has been having a headache since strep. Headache is consistent. Pt reports congestion that might be playing a part as well. Can full headache behind his eyes.  )   HPI Archibald Kobus presents for cough and congestion. A little better latter in the day. Hurts his eyes to look up. Having phonophobia. Congestion persistent. Rhino had been green last week. Clear now. Sputum and posterior drainage have been green.  He denies fever chills and sweats.  He has not had a sore throat or earache.  His headache has been pervasive but not severe.  It is generally on the top of his head and in the sinuses.  Patient is a smoker.  He smokes weeds as well.  Patient did not like the Omnicef  it made him feel weird.  He says he likes amoxicillin  and requests that today.  He declines steroid injection.     08/24/2023    4:07 PM 05/25/2023   11:34 AM 04/10/2023    1:11 PM  Depression screen PHQ 2/9  Decreased Interest 0 0 2  Down, Depressed, Hopeless 0 0 2  PHQ - 2 Score 0 0 4  Altered sleeping 0 0 2  Tired, decreased energy 0 0 2  Change in appetite 0 0 1  Feeling bad or failure about yourself  0 0 2  Trouble concentrating 0 0 2  Moving slowly or fidgety/restless 0 0 0  Suicidal thoughts 0 0 0  PHQ-9 Score 0 0 13  Difficult doing work/chores Not difficult at all  Somewhat difficult    History Dionte has a past medical history of ADD (attention deficit disorder), Anxiety, and Seizure (HCC) (2015).   He has a past surgical history that includes Appendectomy and Colon surgery.   His family history includes Alcohol abuse in his father.He reports that he has been smoking cigarettes and e-cigarettes. He has never used smokeless tobacco. He reports current drug use. Drug: Marijuana. He reports that he does not drink alcohol.    ROS Review of Systems  Constitutional:  Negative for activity change,  appetite change, chills and fever.  HENT:  Positive for congestion, postnasal drip, rhinorrhea and sinus pressure. Negative for ear discharge, ear pain, hearing loss, nosebleeds, sneezing and trouble swallowing.   Respiratory:  Negative for chest tightness and shortness of breath.   Cardiovascular:  Negative for chest pain and palpitations.  Skin:  Negative for rash.  Neurological:  Positive for headaches.    Objective:  BP 106/68   Pulse 64   Temp 97.9 F (36.6 C)   Ht 6\' 1"  (1.854 m)   Wt 228 lb (103.4 kg)   SpO2 97%   BMI 30.08 kg/m   BP Readings from Last 3 Encounters:  08/24/23 106/68  08/05/23 97/64  07/23/23 115/66    Wt Readings from Last 3 Encounters:  08/24/23 228 lb (103.4 kg)  08/05/23 229 lb (103.9 kg)  07/23/23 232 lb (105.2 kg)     Physical Exam Constitutional:      Appearance: He is well-developed.  HENT:     Head: Normocephalic and atraumatic.     Right Ear: Tympanic membrane and external ear normal. No decreased hearing noted.     Left Ear: Tympanic membrane and external ear normal. No decreased hearing noted.     Nose: Mucosal edema present.     Right Sinus:  No frontal sinus tenderness.     Left Sinus: No frontal sinus tenderness.     Mouth/Throat:     Pharynx: No oropharyngeal exudate or posterior oropharyngeal erythema.  Neck:     Meningeal: Brudzinski's sign absent.  Pulmonary:     Effort: No respiratory distress.     Breath sounds: Normal breath sounds.  Lymphadenopathy:     Head:     Right side of head: No preauricular adenopathy.     Left side of head: No preauricular adenopathy.     Cervical:     Right cervical: No superficial cervical adenopathy.    Left cervical: No superficial cervical adenopathy.      Assessment & Plan:  Acute maxillary sinusitis, recurrence not specified  Other orders -     Amoxicillin -Pot Clavulanate; Take 1 tablet by mouth 2 (two) times daily. Take all of this medication  Dispense: 20 tablet; Refill:  0 -     predniSONE; Take 5 daily for 2 days followed by 4,3,2 and 1 for 2 days each.  Dispense: 30 tablet; Refill: 0     Follow-up: No follow-ups on file.  Roise Cleaver, M.D.

## 2024-03-24 IMAGING — DX DG ANKLE COMPLETE 3+V*L*
3 series · 3 of 3 positions shown · non-contrast
Comparison: None Available.

CLINICAL DATA: Trauma, fall, pain

EXAM:
LEFT ANKLE COMPLETE - 3+ VIEW

[ankle ap]
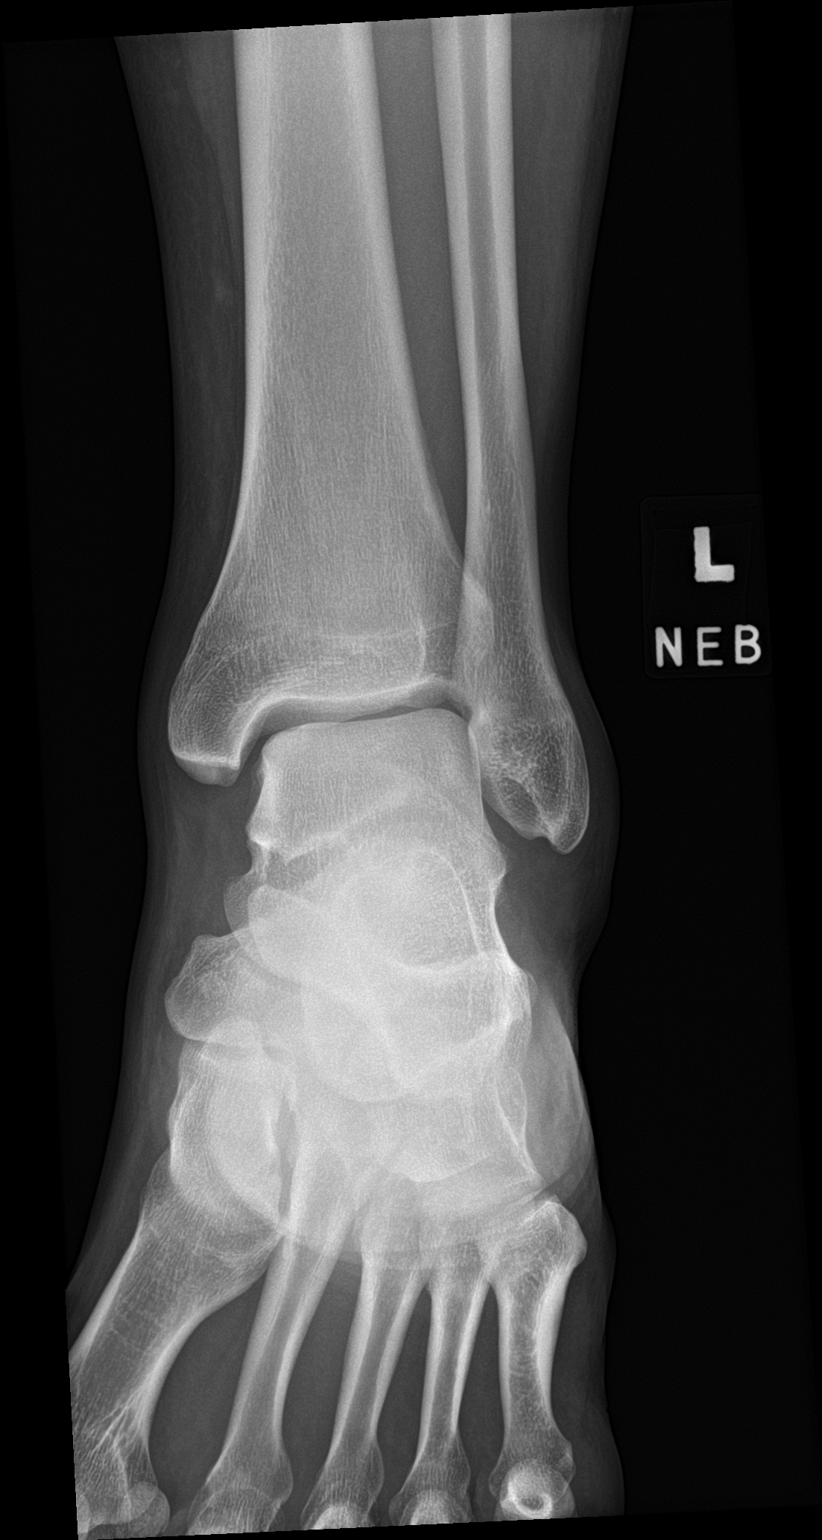

[ankle obl]
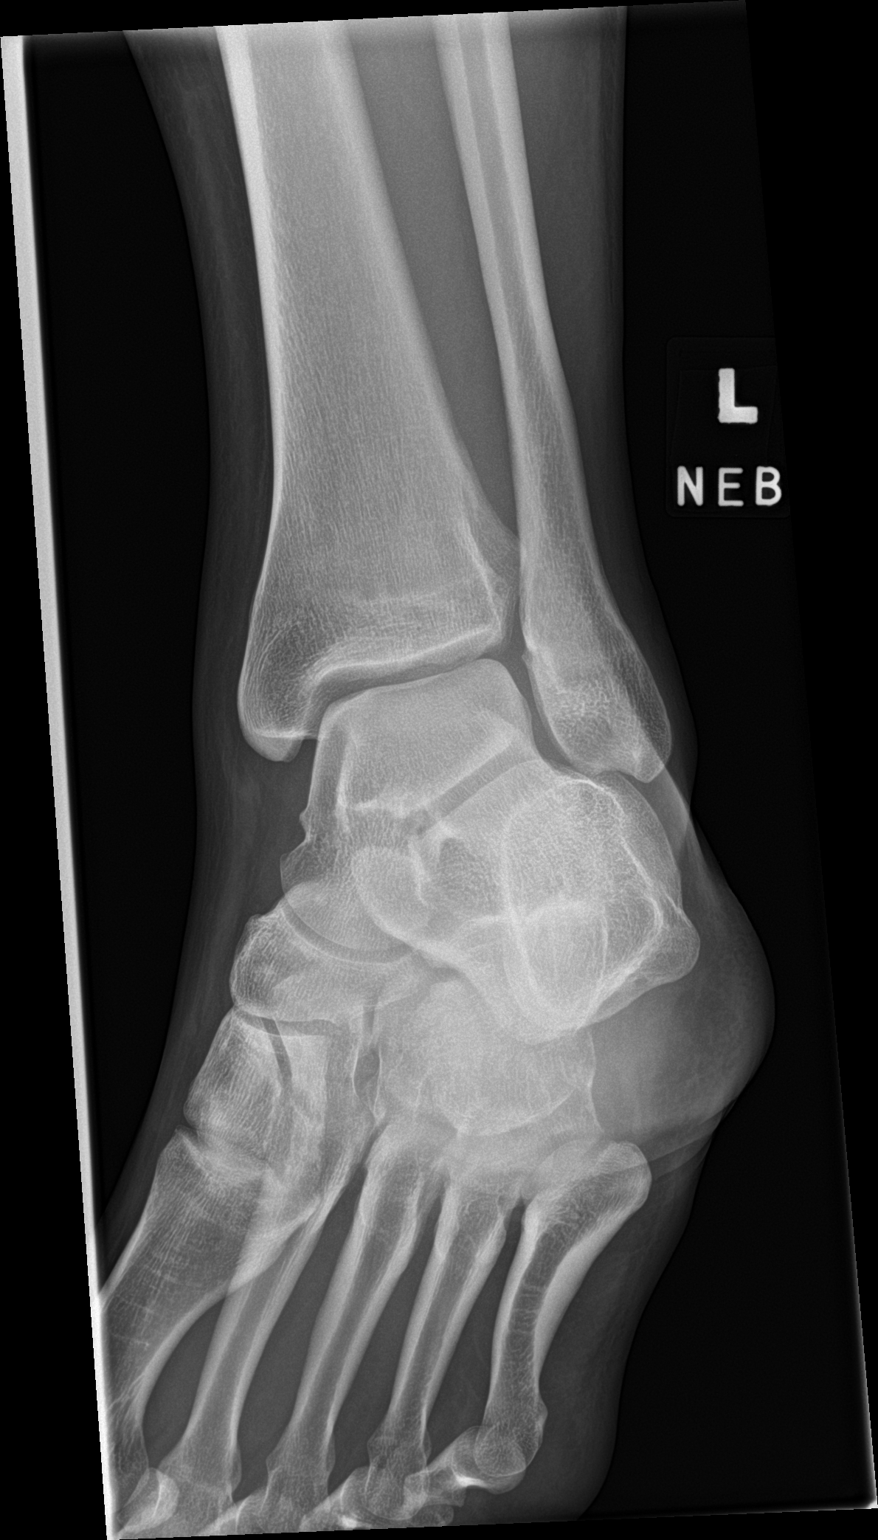

[ankle lat]
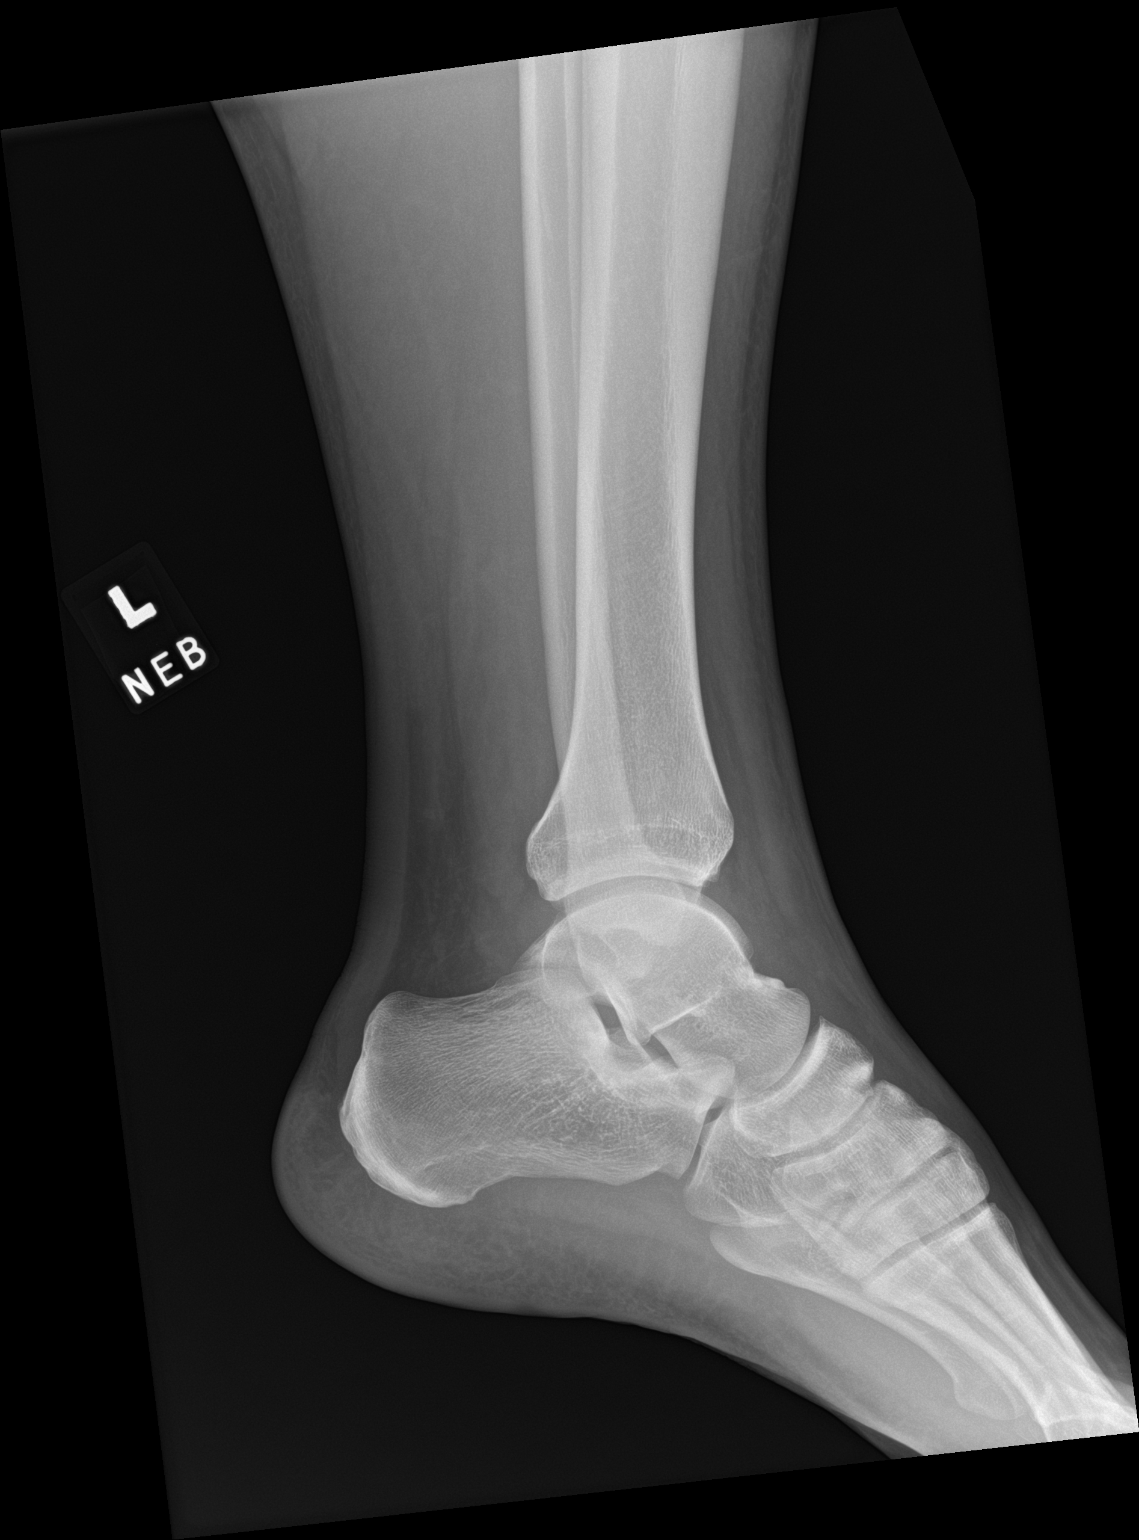

[3 of 3 positions shown; findings below may reference images not displayed]

FINDINGS: There is no evidence of fracture, dislocation, or joint effusion.
There is no evidence of arthropathy or other focal bone abnormality.
Soft tissues are unremarkable.
IMPRESSION: No fracture or dislocation is seen in the left ankle.
